# Patient Record
Sex: Female | Born: 1970 | Race: White | Hispanic: No | Marital: Married | Smoking: Never smoker
Health system: Southern US, Community
[De-identification: ages and names within clinical notes are randomized; demographics above are authoritative.]

## PROBLEM LIST (undated history)

## (undated) DIAGNOSIS — D259 Leiomyoma of uterus, unspecified: Secondary | ICD-10-CM

## (undated) DIAGNOSIS — N920 Excessive and frequent menstruation with regular cycle: Secondary | ICD-10-CM

## (undated) DIAGNOSIS — N83209 Unspecified ovarian cyst, unspecified side: Secondary | ICD-10-CM

## (undated) DIAGNOSIS — N939 Abnormal uterine and vaginal bleeding, unspecified: Secondary | ICD-10-CM

## (undated) DIAGNOSIS — R195 Other fecal abnormalities: Secondary | ICD-10-CM

## (undated) DIAGNOSIS — Z8489 Family history of other specified conditions: Secondary | ICD-10-CM

## (undated) DIAGNOSIS — R519 Headache, unspecified: Secondary | ICD-10-CM

## (undated) DIAGNOSIS — C439 Malignant melanoma of skin, unspecified: Secondary | ICD-10-CM

## (undated) DIAGNOSIS — D219 Benign neoplasm of connective and other soft tissue, unspecified: Secondary | ICD-10-CM

## (undated) HISTORY — DX: Leiomyoma of uterus, unspecified: D25.9

## (undated) HISTORY — DX: Leiomyoma of uterus, unspecified: N93.9

## (undated) HISTORY — DX: Headache, unspecified: R51.9

## (undated) HISTORY — DX: Other fecal abnormalities: R19.5

## (undated) HISTORY — PX: ABDOMINAL HYSTERECTOMY: SHX81

## (undated) HISTORY — DX: Excessive and frequent menstruation with regular cycle: N92.0

## (undated) HISTORY — DX: Abnormal uterine and vaginal bleeding, unspecified: N93.9

## (undated) HISTORY — DX: Unspecified ovarian cyst, unspecified side: N83.209

## (undated) HISTORY — PX: WISDOM TOOTH EXTRACTION: SHX21

## (undated) HISTORY — PX: OTHER SURGICAL HISTORY: SHX169

## (undated) HISTORY — DX: Benign neoplasm of connective and other soft tissue, unspecified: D21.9

---

## 1999-02-27 DIAGNOSIS — C439 Malignant melanoma of skin, unspecified: Secondary | ICD-10-CM

## 1999-02-27 HISTORY — DX: Malignant melanoma of skin, unspecified: C43.9

## 1999-02-27 HISTORY — PX: OTHER SURGICAL HISTORY: SHX169

## 2004-03-17 ENCOUNTER — Inpatient Hospital Stay: Payer: Self-pay | Admitting: Obstetrics & Gynecology

## 2005-04-23 ENCOUNTER — Ambulatory Visit: Payer: Self-pay | Admitting: Internal Medicine

## 2006-05-16 ENCOUNTER — Ambulatory Visit: Payer: Self-pay

## 2006-10-11 ENCOUNTER — Ambulatory Visit: Payer: Self-pay

## 2006-10-25 ENCOUNTER — Ambulatory Visit: Payer: Self-pay

## 2006-10-25 ENCOUNTER — Ambulatory Visit: Payer: Self-pay | Admitting: Urology

## 2006-10-31 ENCOUNTER — Ambulatory Visit: Payer: Self-pay

## 2006-11-22 ENCOUNTER — Emergency Department: Payer: Self-pay | Admitting: Emergency Medicine

## 2006-12-20 ENCOUNTER — Ambulatory Visit: Payer: Self-pay | Admitting: Unknown Physician Specialty

## 2007-02-27 HISTORY — PX: INTRAUTERINE DEVICE (IUD) INSERTION: SHX5877

## 2007-11-01 ENCOUNTER — Inpatient Hospital Stay: Payer: Self-pay | Admitting: Obstetrics & Gynecology

## 2009-03-12 ENCOUNTER — Emergency Department: Payer: Self-pay | Admitting: Emergency Medicine

## 2011-04-11 ENCOUNTER — Ambulatory Visit: Payer: Self-pay

## 2012-02-27 HISTORY — PX: IUD REMOVAL: SHX5392

## 2012-05-23 ENCOUNTER — Ambulatory Visit: Payer: Self-pay

## 2012-05-27 ENCOUNTER — Ambulatory Visit: Payer: Self-pay | Admitting: Obstetrics and Gynecology

## 2013-06-16 ENCOUNTER — Ambulatory Visit: Payer: Self-pay | Admitting: Obstetrics and Gynecology

## 2014-06-22 ENCOUNTER — Ambulatory Visit
Admit: 2014-06-22 | Disposition: A | Discharge status: Home / Self Care | Payer: Self-pay | Attending: Obstetrics and Gynecology | Admitting: Obstetrics and Gynecology

## 2015-06-21 ENCOUNTER — Other Ambulatory Visit: Payer: Self-pay | Admitting: Obstetrics and Gynecology

## 2015-06-21 DIAGNOSIS — Z1231 Encounter for screening mammogram for malignant neoplasm of breast: Secondary | ICD-10-CM

## 2015-06-30 ENCOUNTER — Ambulatory Visit
Admission: RE | Admit: 2015-06-30 | Discharge: 2015-06-30 | Disposition: A | Discharge status: Home / Self Care | Payer: BLUE CROSS/BLUE SHIELD | Source: Ambulatory Visit | Attending: Obstetrics and Gynecology | Admitting: Obstetrics and Gynecology

## 2015-06-30 DIAGNOSIS — Z1231 Encounter for screening mammogram for malignant neoplasm of breast: Secondary | ICD-10-CM | POA: Diagnosis not present

## 2015-06-30 HISTORY — DX: Malignant melanoma of skin, unspecified: C43.9

## 2016-03-04 DIAGNOSIS — J029 Acute pharyngitis, unspecified: Secondary | ICD-10-CM | POA: Diagnosis not present

## 2016-03-04 DIAGNOSIS — R05 Cough: Secondary | ICD-10-CM | POA: Diagnosis not present

## 2016-05-23 ENCOUNTER — Ambulatory Visit (INDEPENDENT_AMBULATORY_CARE_PROVIDER_SITE_OTHER): Payer: BLUE CROSS/BLUE SHIELD | Admitting: Certified Nurse Midwife

## 2016-05-23 ENCOUNTER — Encounter: Payer: Self-pay | Admitting: Certified Nurse Midwife

## 2016-05-23 VITALS — BP 102/62 | HR 64 | Ht 64.0 in | Wt 147.0 lb

## 2016-05-23 DIAGNOSIS — Z13220 Encounter for screening for lipoid disorders: Secondary | ICD-10-CM | POA: Diagnosis not present

## 2016-05-23 DIAGNOSIS — Z85820 Personal history of malignant melanoma of skin: Secondary | ICD-10-CM

## 2016-05-23 DIAGNOSIS — Z01419 Encounter for gynecological examination (general) (routine) without abnormal findings: Secondary | ICD-10-CM

## 2016-05-23 DIAGNOSIS — Z131 Encounter for screening for diabetes mellitus: Secondary | ICD-10-CM

## 2016-05-23 DIAGNOSIS — N852 Hypertrophy of uterus: Secondary | ICD-10-CM

## 2016-05-23 DIAGNOSIS — Z124 Encounter for screening for malignant neoplasm of cervix: Secondary | ICD-10-CM | POA: Diagnosis not present

## 2016-05-23 DIAGNOSIS — Z9889 Other specified postprocedural states: Secondary | ICD-10-CM

## 2016-05-23 NOTE — Progress Notes (Signed)
Gynecology Annual Exam  PCP: No PCP Per Patient  Chief Complaint:  Chief Complaint  Patient presents with  . Gynecologic Exam    History of Present Illness: Patient is a 46 y.o. G2 P2002, LMP 05/14/2016,  presents for her annual exam. The patient reports feeling "for a while"  like her "uterus has dropped". She complains of feeling bloated, and has feelings of having to urinate more frequently and has been having more problems with SUI x1 year. Denies dysuria. Menses are regular. They occur every 1 month , they last 4 days , with 2 heavier days requiring a tampon and pad change every 1.5 to 2 hours,  and are with nickel-sized clots. She has not had any intermenstrual spotting. She denies dysmenorrhea but does have menstrual headaches, relieved with ibup.    The patient's past medical history is remarkable for melanoma. Since her last annual GYN exam dated 05/23/2015 , she has had no other significant changes in her health history (was treated for sinusitis).    She is sexually active. She is currently using a vasectomy for contraception. She denies any history of STDs.  Her most recent pap smear was obtained 04/28/2013 and was negative cells with negative HPV DNA.  Her most recent mammogram obtained on 06/30/2015 was normal. There is no family history of breast cancer. There is no family history of ovarian cancer. The patient does do monthly self breast exams. Her PGF and paternal uncle both had colon cancer The patient does not smoke.  The patient does not drink alcohol.  The patient does not use illegal drugs.  The patient exercises regularly. She has a BMI of 25.23 kg/m2 (05/23/2015).  The patient does not get adequate calcium in her diet.  She had a recent cholesterol screen in 2015 that was normal. She desires other screening labs.     Review of Systems: Review of Systems  Constitutional: Negative for chills, fever and weight loss.  HENT: Negative for congestion, sinus pain and  sore throat.   Eyes: Negative for blurred vision and pain.  Respiratory: Negative for hemoptysis, shortness of breath and wheezing.   Cardiovascular: Negative for chest pain, palpitations and leg swelling.  Gastrointestinal: Positive for constipation. Negative for abdominal pain, blood in stool, diarrhea, heartburn, nausea and vomiting.  Genitourinary: Positive for frequency. Negative for dysuria, hematuria and urgency.       Positive for urinary incontinence/ pelvic pressure/and menorrhagia  Musculoskeletal: Negative for back pain, joint pain and myalgias.  Skin: Negative for itching and rash.  Neurological: Negative for dizziness, tingling and headaches.  Endo/Heme/Allergies: Negative for environmental allergies and polydipsia. Does not bruise/bleed easily.       Negative for hirsutism   Psychiatric/Behavioral: Negative for depression. The patient is not nervous/anxious and does not have insomnia.     Past Medical History:  Past Medical History:  Diagnosis Date  . Melanoma ([place]) 2001   upper abdomen in the 90s/ Dr Nehemiah Massed    Past Surgical History:    Obstetric History: No obstetric history on file.  Family History:  Family History  Problem Relation Age of Onset  . Hypertension Father   . Diabetes Father   . Hyperlipidemia Brother   . Hypertension Brother   . Colon cancer Paternal Grandfather 5  . Colon cancer Paternal Uncle   . Breast cancer Neg Hx     Social History:  Social History   Social History  . Marital status: Married    Spouse name:  Gaspar Bidding Number of children: 2  . Years of education: N/A   Occupational History  . Insurance agent    Social History Main Topics  . Smoking status: Never Smoker  . Smokeless tobacco: Never Used  . Alcohol use No  . Drug use: No  . Sexual activity: Yes    Birth control/ protection: Other-see comments     Comment: vasectomy   Other Topics Concern  . Not on file   Social History Narrative  . No narrative on file      Allergies:  Allergies  Allergen Reactions  . Promethazine Other (See Comments)    uncontrollable reflex    Medications: Prior to Admission medications   Not on File    Physical Exam Vitals: Blood pressure 102/62, pulse 64, height 5\' 4"  (1.626 m), weight 66.7 kg (147 lb), lBMI=25.23 kg/m2 last menstrual period 05/14/2016.  General: NAD HEENT: normocephalic, anicteric Thyroid: no enlargement, no palpable nodules Pulmonary: No increased work of breathing, CTAB Cardiovascular: RRR without murmur Breast: Breast symmetrical, no tenderness, no palpable nodules or masses, no skin or nipple retraction present, no nipple discharge.  No axillary, infraclavicular, or supraclavicular lymphadenopathy. Abdomen: soft, non-tender, non-distended.  Umbilicus without lesions.  No hepatomegaly or masses palpable. No evidence of hernia  Genitourinary:  External: Normal external female genitalia.  Normal urethral meatus, normal Bartholin's and Skene's glands.    Vagina: Normal vaginal mucosa, no evidence of prolapse.    Cervix: Grossly normal in appearance, no bleeding  Uterus: enlarged, irregular uterus on the right, AV, NT  Adnexa: ovaries non-enlarged, no adnexal masses  Rectal: deferred  Lymphatic: no evidence of inguinal lymphadenopathy Extremities: no edema, erythema, or tenderness Neurologic: Grossly intact Psychiatric: mood appropriate, affect full       Assessment: 46 y.o. well woman exam with irregular enlarged uterus. Suspect fibroids which may be responsible for pressure on bladder giving her symptoms of urinary incontinence and pelvic pressure    Plan: Problem List Items Addressed This Visit    None    Visit Diagnoses    Encounter for gynecological examination    -  Primary   Relevant Orders   IGP, Aptima HPV (Completed)   Screening for cervical cancer       Relevant Orders   IGP, Aptima HPV (Completed)   Screening for hyperlipidemia       Relevant Orders   Lipid  Panel With LDL/HDL Ratio (Completed)   Screening for diabetes mellitus       Relevant Orders   Hgb A1c w/o eAG (Completed)   History of melanoma excision       Relevant Orders   Comprehensive metabolic panel (Completed)   Bulky or enlarged uterus       Relevant Orders   US Transvaginal Non-OB      1) Mammogram  - recommend yearly screening mammogram and monthly self breast exam. Patient to schedule her screening mammogram.  2)  Pap  done  3) Osteoporosis prevention.-calcium and vitamin D3 requirements discussed -continue exercise 4) Routine healthcare maintenance including cholesterol, diabetes screening ordered  5) Scheduled for pelvic ultrasound. Follow up with me after ultrasound.  Dalia Heading, CNM

## 2016-05-24 LAB — COMPREHENSIVE METABOLIC PANEL
A/G RATIO: 1.8 (ref 1.2–2.2)
ALK PHOS: 95 IU/L (ref 39–117)
ALT: 20 IU/L (ref 0–32)
AST: 19 IU/L (ref 0–40)
Albumin: 4.4 g/dL (ref 3.5–5.5)
BUN/Creatinine Ratio: 18 (ref 9–23)
BUN: 12 mg/dL (ref 6–24)
Bilirubin Total: 0.7 mg/dL (ref 0.0–1.2)
CO2: 23 mmol/L (ref 18–29)
CREATININE: 0.68 mg/dL (ref 0.57–1.00)
Calcium: 9.2 mg/dL (ref 8.7–10.2)
Chloride: 102 mmol/L (ref 96–106)
GFR calc Af Amer: 122 mL/min/{1.73_m2} (ref >59)
GFR calc non Af Amer: 106 mL/min/{1.73_m2} (ref >59)
GLOBULIN, TOTAL: 2.5 g/dL (ref 1.5–4.5)
Glucose: 87 mg/dL (ref 65–99)
POTASSIUM: 4.9 mmol/L (ref 3.5–5.2)
SODIUM: 140 mmol/L (ref 134–144)
Total Protein: 6.9 g/dL (ref 6.0–8.5)

## 2016-05-24 LAB — LIPID PANEL WITH LDL/HDL RATIO
CHOLESTEROL TOTAL: 156 mg/dL (ref 100–199)
HDL: 51 mg/dL (ref >39)
LDL Calculated: 94 mg/dL (ref 0–99)
LDl/HDL Ratio: 1.8 ratio units (ref 0.0–3.2)
Triglycerides: 57 mg/dL (ref 0–149)
VLDL CHOLESTEROL CAL: 11 mg/dL (ref 5–40)

## 2016-05-24 LAB — HGB A1C W/O EAG: HEMOGLOBIN A1C: 5 % (ref 4.8–5.6)

## 2016-05-26 LAB — IGP, APTIMA HPV
HPV Aptima: NEGATIVE
PAP SMEAR COMMENT: 0

## 2016-06-06 ENCOUNTER — Encounter: Payer: Self-pay | Admitting: Certified Nurse Midwife

## 2016-06-08 ENCOUNTER — Encounter: Payer: Self-pay | Admitting: Certified Nurse Midwife

## 2016-06-08 ENCOUNTER — Ambulatory Visit (INDEPENDENT_AMBULATORY_CARE_PROVIDER_SITE_OTHER): Payer: BLUE CROSS/BLUE SHIELD | Admitting: Certified Nurse Midwife

## 2016-06-08 ENCOUNTER — Ambulatory Visit (INDEPENDENT_AMBULATORY_CARE_PROVIDER_SITE_OTHER): Payer: BLUE CROSS/BLUE SHIELD

## 2016-06-08 VITALS — BP 112/72 | HR 71 | Ht 64.0 in | Wt 146.0 lb

## 2016-06-08 DIAGNOSIS — N852 Hypertrophy of uterus: Secondary | ICD-10-CM

## 2016-06-08 DIAGNOSIS — D251 Intramural leiomyoma of uterus: Secondary | ICD-10-CM | POA: Diagnosis not present

## 2016-06-08 NOTE — Patient Instructions (Signed)

## 2016-06-10 ENCOUNTER — Encounter: Payer: Self-pay | Admitting: Certified Nurse Midwife

## 2016-06-10 DIAGNOSIS — D251 Intramural leiomyoma of uterus: Secondary | ICD-10-CM

## 2016-06-10 HISTORY — DX: Intramural leiomyoma of uterus: D25.1

## 2016-06-10 NOTE — Progress Notes (Signed)
  HPI: 46 year old female who presented for an annual exam with complaints of pelvic pressure and worsening stress urinary incontinence. On exam she was noted to have an enlarged and irregular uterus. She returns today for a follow visit after her pelvic  ultrasound today  Ultrasound demonstrates two fibroids, the largest is 4.4 x 3.6 cm and is in the lower uterine segment. The second fibroid is 3.8 x3.7 cm. Endometrial stripe is 9 mm  PMHx: She  has a past medical history of Melanoma ([place]) (2001). Also,  has a past surgical history that includes excision of melanoma and Wisdom tooth extraction., family history includes Colon cancer in her paternal uncle; Colon cancer (age of onset: 63) in her paternal grandfather; Diabetes in her father; Hyperlipidemia in her brother; Hypertension in her brother and father.,  reports that she has never smoked. She has never used smokeless tobacco. She reports that she does not drink alcohol or use drugs.  She currently has no medications in their medication list. Also, is allergic to promethazine.  ROS  Objective: BP 112/72   Pulse 71   Ht 5\' 4"  (2.841 m)   Wt 146 lb (66.2 kg)   LMP 05/14/2016 (Exact Date)   BMI 25.06 kg/m   Physical examination Constitutional NAD, Conversant  Skin No rashes, lesions or ulceration.   Extremities: Moves all appropriately.  Normal ROM for age. No lymphadenopathy.  Neuro: Grossly intact  Psych: Oriented to PPT.  Normal mood. Normal affect.   Assessment:  Uterine fibroids-symptomatic for pelvic pressure and worsening SUI  Plan: Discussed expectant management vs vs surgical or radiological intervention. Patient desires to talk further of surgical treatment options with gynecologist Schedule conference appt with Dr Kenton Kingfisher in 1 mos  Dalia Heading, [place]

## 2016-07-06 ENCOUNTER — Other Ambulatory Visit: Payer: Self-pay | Admitting: Certified Nurse Midwife

## 2016-07-06 ENCOUNTER — Other Ambulatory Visit: Payer: Self-pay | Admitting: Obstetrics and Gynecology

## 2016-07-06 DIAGNOSIS — Z1231 Encounter for screening mammogram for malignant neoplasm of breast: Secondary | ICD-10-CM

## 2016-07-09 ENCOUNTER — Ambulatory Visit: Payer: BLUE CROSS/BLUE SHIELD | Admitting: Obstetrics & Gynecology

## 2016-07-20 DIAGNOSIS — S0300XA Dislocation of jaw, unspecified side, initial encounter: Secondary | ICD-10-CM | POA: Diagnosis not present

## 2016-07-20 DIAGNOSIS — H9201 Otalgia, right ear: Secondary | ICD-10-CM | POA: Diagnosis not present

## 2016-07-20 DIAGNOSIS — H6991 Unspecified Eustachian tube disorder, right ear: Secondary | ICD-10-CM | POA: Diagnosis not present

## 2016-07-30 DIAGNOSIS — L723 Sebaceous cyst: Secondary | ICD-10-CM | POA: Diagnosis not present

## 2016-07-30 DIAGNOSIS — H93299 Other abnormal auditory perceptions, unspecified ear: Secondary | ICD-10-CM | POA: Diagnosis not present

## 2016-07-30 DIAGNOSIS — M26609 Unspecified temporomandibular joint disorder, unspecified side: Secondary | ICD-10-CM | POA: Diagnosis not present

## 2016-08-01 ENCOUNTER — Ambulatory Visit
Admission: RE | Admit: 2016-08-01 | Discharge: 2016-08-01 | Disposition: A | Discharge status: Home / Self Care | Payer: BLUE CROSS/BLUE SHIELD | Source: Ambulatory Visit | Attending: Certified Nurse Midwife | Admitting: Certified Nurse Midwife

## 2016-08-01 DIAGNOSIS — Z1231 Encounter for screening mammogram for malignant neoplasm of breast: Secondary | ICD-10-CM | POA: Diagnosis not present

## 2016-11-12 ENCOUNTER — Encounter: Payer: Self-pay | Admitting: Primary Care

## 2016-11-12 ENCOUNTER — Ambulatory Visit (INDEPENDENT_AMBULATORY_CARE_PROVIDER_SITE_OTHER): Payer: BLUE CROSS/BLUE SHIELD | Admitting: Primary Care

## 2016-11-12 VITALS — BP 108/70 | HR 68 | Temp 98.3°F | Ht 63.5 in | Wt 148.1 lb

## 2016-11-12 DIAGNOSIS — Z23 Encounter for immunization: Secondary | ICD-10-CM

## 2016-11-12 DIAGNOSIS — K5909 Other constipation: Secondary | ICD-10-CM

## 2016-11-12 DIAGNOSIS — R1030 Lower abdominal pain, unspecified: Secondary | ICD-10-CM | POA: Diagnosis not present

## 2016-11-12 DIAGNOSIS — R140 Abdominal distension (gaseous): Secondary | ICD-10-CM

## 2016-11-12 MED ORDER — DICYCLOMINE HCL 10 MG PO CAPS: ORAL_CAPSULE | ORAL | 0 refills | Status: DC

## 2016-11-12 NOTE — Addendum Note (Signed)
Addended by: Jacqualin Combes on: 11/12/2016 05:12 PM   Modules accepted: Orders

## 2016-11-12 NOTE — Assessment & Plan Note (Signed)
Ongoing for years. Not drinking enough water, not exercising, decent fiber intake. Discussed to increase water to 64 ounces daily, start exercising. Discussed to increase docusate sodium to daily use as needed. Do suspect rectal bleeding to be secondary to hemorrhoid secondary to straining during bowel movements. No alarm signs.

## 2016-11-12 NOTE — Progress Notes (Signed)
Subjective:    Patient ID: Cheryl Schmidt, female    DOB: 1970-08-27, 46 y.o.   MRN: [id number]  HPI  Cheryl Schmidt is a 46 year old female who presents today to establish care and discuss the problems mentioned below. Will obtain old records.  1) Chronic Constipation/Abdominal Pain: History of chronic constipation for years. Bloating and abdominal pain have been present for the past 2 months. She has noticed scant amount of bright red blood on occasion when wiping after straining during a bowel movement with constipation.   She is currently taking docusate sodium twice weekly on average. Bowel movements occur every 3-4 days on average. She denies personal and family history of diverticulitis, ulcerative colitis, Crohn's disease. Bloating will occur with each meal that will last several hours. She denies unexplained weight loss, increased rectal bleeding. Family history of colon cancer in paternal grandfather and paternal uncle.   Diet currently consists of:  Breakfast: Yogurt, granola bar Lunch: Salad, sandwich Dinner: Meat, pasta, green beans, asparagus, salad, brussels sprouts, corn Snacks: None Desserts: None Beverages: She drinks 16 ounces of water daily, mostly drinks half sweet/unsweet tea.   Exercise: Currently not exercising.    Review of Systems  Constitutional: Negative for fever and unexpected weight change.  Respiratory: Negative for shortness of breath.   Cardiovascular: Negative for chest pain.  Gastrointestinal: Positive for abdominal distention, abdominal pain, blood in stool and constipation. Negative for diarrhea, nausea and rectal pain.  Neurological: Negative for weakness.       Past Medical History:  Diagnosis Date  . Melanoma ([place]) 2001   upper abdomen in the 90s/ Dr Nehemiah Massed     Social History   Social History  . Marital status: Married    Spouse name: Gaspar Bidding  . Number of children: 2  . Years of education: N/A   Occupational History  . Insurance  agent    Social History Main Topics  . Smoking status: Never Smoker  . Smokeless tobacco: Never Used  . Alcohol use No  . Drug use: No  . Sexual activity: Yes    Birth control/ protection: Other-see comments     Comment: vasectomy   Other Topics Concern  . Not on file   Social History Narrative   Married.   2 children.   Works in Insurance underwriter.   Enjoys spending time with family, walking, exercising.    Past Surgical History:  Procedure Laterality Date  . excision of melanoma    . WISDOM TOOTH EXTRACTION      Family History  Problem Relation Age of Onset  . Hypertension Father   . Diabetes Father   . Hyperlipidemia Brother   . Hypertension Brother   . Colon cancer Paternal Grandfather 72  . Colon cancer Paternal Uncle   . Breast cancer Neg Hx     Allergies  Allergen Reactions  . Promethazine Other (See Comments)    uncontrollable reflex    No current outpatient prescriptions on file prior to visit.   No current facility-administered medications on file prior to visit.     BP 108/70   Pulse 68   Temp 98.3 F (36.8 C) (Oral)   Ht 5' 3.5" (1.613 m)   Wt 148 lb 1.9 oz (67.2 kg)   LMP 10/24/2016   SpO2 97%   BMI 25.83 kg/m    Objective:   Physical Exam  Constitutional: She appears well-nourished.  Neck: Neck supple.  Cardiovascular: Normal rate and regular rhythm.   Pulmonary/Chest: Effort  normal and breath sounds normal.  Abdominal: Soft. Normal appearance and bowel sounds are normal. There is tenderness in the right lower quadrant and left lower quadrant.  Skin: Skin is warm and dry.  Psychiatric: She has a normal mood and affect.          Assessment & Plan:

## 2016-11-12 NOTE — Assessment & Plan Note (Signed)
Exam today with tenderness, doesn't appear acutely ill. Labs in March 2018 reviewed and are reassuring. 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 Will obtain abdominal ultrasound for further evaluation. Rx for dicyclomine trail sent to pharmacy for bloating. She will update if no improvement in 1-2 weeks.   Consider GI referral for colonoscopy if no improvement.

## 2016-11-12 NOTE — Patient Instructions (Signed)
Increase intake of fiber and water to reduce constipation.  Start exercising. You should be getting 150 minutes of moderate intensity exercise weekly.  Ensure you are consuming 64 ounces of water daily.  You may take the stool softener daily if needed for constipation.  Stop by the front desk and speak with either Rosaria Ferries or Shirlean Mylar regarding your ultrasound.  Please notify me if no improvement in 1-2 weeks.   It was a pleasure to meet you today! Please don't hesitate to call me with any questions. Welcome to [facility]!    High-Fiber Diet Fiber, also called dietary fiber, is a type of carbohydrate found in fruits, vegetables, whole grains, and beans. A high-fiber diet can have many health benefits. Your health care provider may recommend a high-fiber diet to help:  Prevent constipation. Fiber can make your bowel movements more regular.  Lower your cholesterol.  Relieve hemorrhoids, uncomplicated diverticulosis, or irritable bowel syndrome.  Prevent overeating as part of a weight-loss plan.  Prevent heart disease, type 2 diabetes, and certain cancers.  What is my plan? The recommended daily intake of fiber includes:  38 grams for men under age 26.  36 grams for men over age 61.  45 grams for women under age 6.  47 grams for women over age 44.  You can get the recommended daily intake of dietary fiber by eating a variety of fruits, vegetables, grains, and beans. Your health care provider may also recommend a fiber supplement if it is not possible to get enough fiber through your diet. What do I need to know about a high-fiber diet?  Fiber supplements have not been widely studied for their effectiveness, so it is better to get fiber through food sources.  Always check the fiber content on thenutrition facts label of any prepackaged food. Look for foods that contain at least 5 grams of fiber per serving.  Ask your dietitian if you have questions about specific foods that are  related to your condition, especially if those foods are not listed in the following section.  Increase your daily fiber consumption gradually. Increasing your intake of dietary fiber too quickly may cause bloating, cramping, or gas.  Drink plenty of water. Water helps you to digest fiber. What foods can I eat? Grains Whole-grain breads. Multigrain cereal. Oats and oatmeal. Brown rice. Barley. Bulgur wheat. [place]. Bran muffins. Popcorn. Rye wafer crackers. Vegetables Sweet potatoes. Spinach. Kale. Artichokes. Cabbage. Broccoli. Green peas. Carrots. Squash. Fruits Berries. Pears. Apples. Oranges. Avocados. Prunes and raisins. Dried figs. Meats and Other Protein Sources Navy, kidney, pinto, and soy beans. Split peas. Lentils. Nuts and seeds. Dairy Fiber-fortified yogurt. Beverages Fiber-fortified soy milk. Fiber-fortified orange juice. Other Fiber bars. The items listed above may not be a complete list of recommended foods or beverages. Contact your dietitian for more options. What foods are not recommended? Grains White bread. Pasta made with refined flour. White rice. Vegetables Fried potatoes. Canned vegetables. Well-cooked vegetables. Fruits Fruit juice. Cooked, strained fruit. Meats and Other Protein Sources Fatty cuts of meat. Fried Sales executive or fried fish. Dairy Milk. Yogurt. Cream cheese. Sour cream. Beverages Soft drinks. Other Cakes and pastries. Butter and oils. The items listed above may not be a complete list of foods and beverages to avoid. Contact your dietitian for more information. What are some tips for including high-fiber foods in my diet?  Eat a wide variety of high-fiber foods.  Make sure that half of all grains consumed each day are whole grains.  Replace breads  and cereals made from refined flour or white flour with whole-grain breads and cereals.  Replace white rice with brown rice, bulgur wheat, or millet.  Start the day with a breakfast that is  high in fiber, such as a cereal that contains at least 5 grams of fiber per serving.  Use beans in place of meat in soups, salads, or pasta.  Eat high-fiber snacks, such as berries, raw vegetables, nuts, or popcorn. This information is not intended to replace advice given to you by your health care provider. Make sure you discuss any questions you have with your health care provider. Document Released: 02/12/2005 Document Revised: 07/21/2015 Document Reviewed: 07/28/2013 Elsevier Interactive Patient Education  2017 [employer].

## 2016-11-16 ENCOUNTER — Ambulatory Visit
Admission: RE | Admit: 2016-11-16 | Discharge: 2016-11-16 | Disposition: A | Discharge status: Home / Self Care | Payer: BLUE CROSS/BLUE SHIELD | Source: Ambulatory Visit | Attending: Primary Care | Admitting: Primary Care

## 2016-11-16 DIAGNOSIS — R140 Abdominal distension (gaseous): Secondary | ICD-10-CM | POA: Diagnosis not present

## 2016-11-16 DIAGNOSIS — K7689 Other specified diseases of liver: Secondary | ICD-10-CM | POA: Insufficient documentation

## 2016-11-16 DIAGNOSIS — R1030 Lower abdominal pain, unspecified: Secondary | ICD-10-CM | POA: Diagnosis not present

## 2016-11-16 DIAGNOSIS — K5909 Other constipation: Secondary | ICD-10-CM | POA: Diagnosis not present

## 2017-02-28 DIAGNOSIS — R070 Pain in throat: Secondary | ICD-10-CM | POA: Diagnosis not present

## 2017-02-28 DIAGNOSIS — H6980 Other specified disorders of Eustachian tube, unspecified ear: Secondary | ICD-10-CM | POA: Diagnosis not present

## 2017-02-28 DIAGNOSIS — D489 Neoplasm of uncertain behavior, unspecified: Secondary | ICD-10-CM | POA: Diagnosis not present

## 2017-04-25 DIAGNOSIS — L723 Sebaceous cyst: Secondary | ICD-10-CM | POA: Diagnosis not present

## 2017-04-25 DIAGNOSIS — J019 Acute sinusitis, unspecified: Secondary | ICD-10-CM | POA: Diagnosis not present

## 2017-05-31 ENCOUNTER — Encounter: Payer: Self-pay | Admitting: Certified Nurse Midwife

## 2017-05-31 ENCOUNTER — Ambulatory Visit (INDEPENDENT_AMBULATORY_CARE_PROVIDER_SITE_OTHER): Payer: BLUE CROSS/BLUE SHIELD | Admitting: Certified Nurse Midwife

## 2017-05-31 VITALS — BP 100/60 | HR 72 | Ht 64.0 in | Wt 146.0 lb

## 2017-05-31 DIAGNOSIS — Z1231 Encounter for screening mammogram for malignant neoplasm of breast: Secondary | ICD-10-CM

## 2017-05-31 DIAGNOSIS — R5383 Other fatigue: Secondary | ICD-10-CM

## 2017-05-31 DIAGNOSIS — D219 Benign neoplasm of connective and other soft tissue, unspecified: Secondary | ICD-10-CM | POA: Diagnosis not present

## 2017-05-31 DIAGNOSIS — Z124 Encounter for screening for malignant neoplasm of cervix: Secondary | ICD-10-CM | POA: Diagnosis not present

## 2017-05-31 DIAGNOSIS — N920 Excessive and frequent menstruation with regular cycle: Secondary | ICD-10-CM | POA: Diagnosis not present

## 2017-05-31 DIAGNOSIS — Z0000 Encounter for general adult medical examination without abnormal findings: Secondary | ICD-10-CM

## 2017-05-31 DIAGNOSIS — Z1239 Encounter for other screening for malignant neoplasm of breast: Secondary | ICD-10-CM

## 2017-05-31 DIAGNOSIS — D259 Leiomyoma of uterus, unspecified: Secondary | ICD-10-CM | POA: Insufficient documentation

## 2017-05-31 NOTE — Progress Notes (Signed)
Gynecology Annual Exam  PCP: Pleas Koch, NP  Chief Complaint:  Chief Complaint  Patient presents with  . Gynecologic Exam    History of Present Illness: Patient is a 47 y.o. MWF, G2 P2002, LMP 05/14/2016,  Who presents for her annual exam. The patient was diagnosed with fibroids last year and she continues to complain of feeling bloated, urinary frequency and lower abdominal tenderness.  Menses are regular. They occur every 1 month , they last 4-5 days , with 2-3 heavier days requiring a tampon and pad change every 1 to 2 hours,  and are with nickel-sized clots. She has not had any intermenstrual spotting. She has dysmenorrhea beginning a couple days before her menses and lasting thru the first 2-3 days of her menses. The patient's past medical history is remarkable for melanoma. She is followed by Dr Nehemiah Massed annually. She has chronic constipation and has hemorrhoids which sometimes bleeds with straining to have a BM. She saw her PCP last year who recommended stool softeners and increased water intake which has had +/- effect. Since her last annual GYN exam dated 05/23/2016 , she has had no other significant changes in her health history. She is sexually active. She is currently using a vasectomy for contraception. She denies any history of STDs.  Her most recent pap smear was obtained 05/23/2016 and was negative cells with negative HPV DNA.  Her most recent mammogram obtained on 08/01/2016 was normal. There is no family history of breast cancer. There is no family history of ovarian cancer. The patient does do self breast exams about every other month. Her PGF and paternal uncle both had colon cancer The patient does not smoke.  The patient does drink alcohol occasionally (1 glass of wine/month)  The patient does not use illegal drugs.  The patient has been exercising less because she feels fatigued. She has a BMI of 25.06 kg/m2 (05/31/2017).  The patient does get adequate calcium in  her diet.  She had a recent cholesterol screen in 2018 that was normal.      Review of Systems: Review of Systems  Constitutional: Negative for chills, fever and weight loss.  HENT: Negative for congestion, sinus pain and sore throat.   Eyes: Negative for blurred vision and pain.  Respiratory: Negative for hemoptysis, shortness of breath and wheezing.   Cardiovascular: Negative for chest pain, palpitations and leg swelling.  Gastrointestinal: Positive for abdominal pain (tenderness), blood in stool (with straining/ hemorrhoids) and constipation. Negative for diarrhea, heartburn, nausea and vomiting.  Genitourinary: Positive for frequency. Negative for dysuria, hematuria and urgency.       Positive for urinary incontinence/ pelvic pressure/and menorrhagia  Musculoskeletal: Negative for back pain, joint pain and myalgias.  Skin: Negative for itching and rash.  Neurological: Positive for headaches. Negative for dizziness and tingling.  Endo/Heme/Allergies: Negative for environmental allergies and polydipsia. Does not bruise/bleed easily.       Negative for hirsutism   Psychiatric/Behavioral: Negative for depression. The patient is not nervous/anxious and does not have insomnia.     Past Medical History:  Past Medical History:  Diagnosis Date  . Fibroids   . Melanoma ([place]) 2001   upper abdomen in the 90s/ Dr Nehemiah Massed  . Menorrhagia with regular cycle   . Ovarian cyst     Past Surgical History:  Past Surgical History:  Procedure Laterality Date  . Excision of melanoma    . INTRAUTERINE DEVICE (IUD) INSERTION  2009  . IUD REMOVAL  2014  . WISDOM TOOTH EXTRACTION       Obstetric History:  OB History  Gravida Para Term Preterm AB Living  2 2 2     2   SAB TAB Ectopic Multiple Live Births          2    # Outcome Date GA Lbr Len/2nd Weight Sex Delivery Anes PTL Lv  2 Term 11/01/07   7 lb 5 oz (3.317 kg) M Vag-Spont   LIV  1 Term 03/18/04   7 lb 9 oz (3.43 kg) F Vag-Spont    LIV    Family History:  Family History  Problem Relation Age of Onset  . Hypertension Father   . Diabetes Father   . Hyperlipidemia Brother   . Hypertension Brother   . Colon cancer Paternal Grandfather 89  . Colon cancer Paternal Uncle 44  . Breast cancer Neg Hx     Social History:  Social History   Socioeconomic History  . Marital status: Married    Spouse name: Gaspar Bidding  . Number of children: 2  . Years of education: Not on file  . Highest education level: Not on file  Occupational History  . Occupation: Medical illustrator  Social Needs  . Financial resource strain: Not on file  . Food insecurity:    Worry: Not on file    Inability: Not on file  . Transportation needs:    Medical: Not on file    Non-medical: Not on file  Tobacco Use  . Smoking status: Never Smoker  . Smokeless tobacco: Never Used  Substance and Sexual Activity  . Alcohol use: Yes    Comment: 1 glass of wine/month  . Drug use: No  . Sexual activity: Yes    Partners: Male    Birth control/protection: Other-see comments    Comment: vasectomy  Lifestyle  . Physical activity:    Days per week: 2 days    Minutes per session: 60 min  . Stress: Only a little  Relationships  . Social connections:    Talks on phone: Not on file    Gets together: Not on file    Attends religious service: Not on file    Active member of club or organization: Not on file    Attends meetings of clubs or organizations: Not on file    Relationship status: Not on file  . Intimate partner violence:    Fear of current or ex partner: Not on file    Emotionally abused: Not on file    Physically abused: Not on file    Forced sexual activity: Not on file  Other Topics Concern  . Not on file  Social History Narrative   Married.   2 children.   Works in Insurance underwriter.   Enjoys spending time with family, walking, exercising.    Allergies:  Allergies  Allergen Reactions  . Promethazine Other (See Comments)    uncontrollable  reflex    Medications: Current Outpatient Medications:  .  naproxen sodium (ALEVE) 220 MG tablet, Take by mouth., Disp: , Rfl:  Physical Exam Vitals: BP 100/60   Pulse 72   Ht 5\' 4"  (1.626 m)   Wt 146 lb (66.2 kg)   LMP 05/15/2017 (Exact Date)   BMI 25.06 kg/m  General: WF in NAD HEENT: normocephalic, anicteric Thyroid: no enlargement, no palpable nodules Pulmonary: No increased work of breathing, CTAB Cardiovascular: RRR without murmur Breast: Breast symmetrical, no tenderness, no palpable nodules or masses, no skin or nipple  retraction present, no nipple discharge.  No axillary, infraclavicular, or supraclavicular lymphadenopathy. Abdomen: soft, non-distended.  Umbilicus without lesions.  No hepatomegaly. Fundal height 4FB above the symphysis/ TTP in the lower abdomen. No evidence of hernia  Genitourinary:  External: Normal external female genitalia.  Normal urethral meatus, normal Bartholin's and Skene's glands.    Vagina: Normal vaginal mucosa, no evidence of prolapse.    Cervix: Grossly normal in appearance, no bleeding, posterior  Uterus: enlarged 12 week size, irregular uterus on the right, AV, mild tenderness  Adnexa: ovaries non-enlarged, no adnexal masses-difficult exam due to enlarged uterus.  Rectal: deferred  Lymphatic: no evidence of inguinal lymphadenopathy Extremities: no edema, erythema, or tenderness Neurologic: Grossly intact Psychiatric: mood appropriate, affect full       Assessment: 47 y.o. well woman exam with 12 week size uterus due to symptomatic fibroids.   Patient would like appointment to see MD to discuss options for treatment of fibroids.-will schedule   Fatigue: possibly due to anemia from menorrhagia   CBC, ferratin, vit B12, Vitamin D3   Plan: Problem List Items Addressed This Visit      Other   Fibroids   Menorrhagia with regular cycle   Relevant Orders   CBC with Differential/Platelet   Ferritin    Other Visit Diagnoses     Fatigue, unspecified type    -  Primary   Relevant Orders   Vitamin B12   VITAMIN D 25 Hydroxy (Vit-D Deficiency, Fractures)   Screening for cervical cancer       Relevant Orders   IGP,rfx Aptima HPV all pth   Screening for breast cancer       Relevant Orders   MM DIGITAL SCREENING BILATERAL      1) Mammogram  - recommend yearly screening mammogram and monthly self breast exam. Patient to schedule her screening mammogram which is due after 08/01/2017  2)  Pap  done  3) Osteoporosis prevention.-calcium and vitamin D3 requirements discussed  4) Routine healthcare maintenance including cholesterol, diabetes screening UTD  5) Recommend Benefiber daily for tratment of chronic constipation/ hemorrhoids  Dalia Heading, CNM

## 2017-06-01 LAB — CBC WITH DIFFERENTIAL/PLATELET
Basophils Absolute: 0 10*3/uL (ref 0.0–0.2)
Basos: 1 %
EOS (ABSOLUTE): 0.1 10*3/uL (ref 0.0–0.4)
EOS: 1 %
HEMATOCRIT: 41 % (ref 34.0–46.6)
HEMOGLOBIN: 13 g/dL (ref 11.1–15.9)
IMMATURE GRANS (ABS): 0 10*3/uL (ref 0.0–0.1)
IMMATURE GRANULOCYTES: 0 %
LYMPHS: 24 %
Lymphocytes Absolute: 1.9 10*3/uL (ref 0.7–3.1)
MCH: 27.8 pg (ref 26.6–33.0)
MCHC: 31.7 g/dL (ref 31.5–35.7)
MCV: 88 fL (ref 79–97)
Monocytes Absolute: 0.6 10*3/uL (ref 0.1–0.9)
Monocytes: 8 %
NEUTROS PCT: 66 %
Neutrophils Absolute: 5.2 10*3/uL (ref 1.4–7.0)
Platelets: 319 10*3/uL (ref 150–379)
RBC: 4.67 x10E6/uL (ref 3.77–5.28)
RDW: 13.8 % (ref 12.3–15.4)
WBC: 7.8 10*3/uL (ref 3.4–10.8)

## 2017-06-01 LAB — VITAMIN D 25 HYDROXY (VIT D DEFICIENCY, FRACTURES): Vit D, 25-Hydroxy: 29.4 ng/mL — ABNORMAL LOW (ref 30.0–100.0)

## 2017-06-01 LAB — VITAMIN B12: Vitamin B-12: 557 pg/mL (ref 232–1245)

## 2017-06-01 LAB — FERRITIN: Ferritin: 15 ng/mL (ref 15–150)

## 2017-06-03 LAB — IGP,RFX APTIMA HPV ALL PTH: PAP Smear Comment: 0

## 2017-06-10 DIAGNOSIS — D2261 Melanocytic nevi of right upper limb, including shoulder: Secondary | ICD-10-CM | POA: Diagnosis not present

## 2017-06-10 DIAGNOSIS — D2260 Melanocytic nevi of unspecified upper limb, including shoulder: Secondary | ICD-10-CM | POA: Diagnosis not present

## 2017-06-10 DIAGNOSIS — D485 Neoplasm of uncertain behavior of skin: Secondary | ICD-10-CM | POA: Diagnosis not present

## 2017-06-10 DIAGNOSIS — Z85820 Personal history of malignant melanoma of skin: Secondary | ICD-10-CM | POA: Diagnosis not present

## 2017-06-10 DIAGNOSIS — Z1283 Encounter for screening for malignant neoplasm of skin: Secondary | ICD-10-CM | POA: Diagnosis not present

## 2017-06-10 DIAGNOSIS — D225 Melanocytic nevi of trunk: Secondary | ICD-10-CM | POA: Diagnosis not present

## 2017-06-10 DIAGNOSIS — L818 Other specified disorders of pigmentation: Secondary | ICD-10-CM | POA: Diagnosis not present

## 2017-06-13 ENCOUNTER — Telehealth: Payer: Self-pay

## 2017-06-13 NOTE — Telephone Encounter (Signed)
Pt returning CLG's call. [phone]

## 2017-06-14 NOTE — Telephone Encounter (Signed)
Contacted patient with lab results. Recommend 1000IU vitamin D3 daly and a multivitamin with iron

## 2017-07-01 ENCOUNTER — Ambulatory Visit (INDEPENDENT_AMBULATORY_CARE_PROVIDER_SITE_OTHER): Payer: BLUE CROSS/BLUE SHIELD | Admitting: Obstetrics and Gynecology

## 2017-07-01 ENCOUNTER — Encounter: Payer: Self-pay | Admitting: Obstetrics and Gynecology

## 2017-07-01 VITALS — BP 112/72 | HR 73 | Ht 64.0 in | Wt 150.0 lb

## 2017-07-01 DIAGNOSIS — N946 Dysmenorrhea, unspecified: Secondary | ICD-10-CM

## 2017-07-01 DIAGNOSIS — N939 Abnormal uterine and vaginal bleeding, unspecified: Secondary | ICD-10-CM

## 2017-07-01 DIAGNOSIS — D251 Intramural leiomyoma of uterus: Secondary | ICD-10-CM

## 2017-07-01 MED ORDER — LEUPROLIDE ACETATE (PED) 11.25 MG IM KIT: 11.2500 mg | PACK | INTRAMUSCULAR | 0 refills | Status: DC

## 2017-07-01 NOTE — Progress Notes (Signed)
Obstetrics & Gynecology Surgery H&P    Chief Complaint: Scheduled Surgery   History of Present Illness: Patient is a 47 y.o. [id number] presenting to discuss possible hysterectomy, for the treatment or further evaluation of abnormal uterine bleeding and dysmenorrhea.   Prior Treatments prior to proceeding with surgery include: expectant management  Preoperative Pap:05/23/2016 NIL HPV negative Preoperative Endometrial biopsy: discussed obtain at time of preop appointment Preoperative Ultrasound: 05/31/2017 showing a posterior cervical 4cm fibroid.  Adnexa imaged normally.  Size of fibroid stable in relation to prior imaging obtained 1 year ago 06/08/2016  H&H obtained 05/31/2017 13.0g/dL and 41.0%  Review of Systems:10 point review of systems  Past Medical History:  Past Medical History:  Diagnosis Date  . Fibroids   . Melanoma ([place]) 2001   upper abdomen in the 90s/ Dr Nehemiah Massed  . Menorrhagia with regular cycle   . Ovarian cyst     Past Surgical History:  Past Surgical History:  Procedure Laterality Date  . Excision of melanoma    . INTRAUTERINE DEVICE (IUD) INSERTION  2009  . IUD REMOVAL  2014  . WISDOM TOOTH EXTRACTION      Family History:  Family History  Problem Relation Age of Onset  . Hypertension Father   . Diabetes Father   . Hyperlipidemia Brother   . Hypertension Brother   . Colon cancer Paternal Grandfather 48  . Colon cancer Paternal Uncle 24  . Breast cancer Neg Hx     Social History:  Social History   Socioeconomic History  . Marital status: Married    Spouse name: Gaspar Bidding  . Number of children: 2  . Years of education: Not on file  . Highest education level: Not on file  Occupational History  . Occupation: Medical illustrator  Social Needs  . Financial resource strain: Not on file  . Food insecurity:    Worry: Not on file    Inability: Not on file  . Transportation needs:    Medical: Not on file    Non-medical: Not on file  Tobacco Use  .  Smoking status: Never Smoker  . Smokeless tobacco: Never Used  Substance and Sexual Activity  . Alcohol use: Yes    Comment: 1 glass of wine/month  . Drug use: No  . Sexual activity: Yes    Partners: Male    Birth control/protection: Other-see comments    Comment: vasectomy  Lifestyle  . Physical activity:    Days per week: 2 days    Minutes per session: 60 min  . Stress: Only a little  Relationships  . Social connections:    Talks on phone: Not on file    Gets together: Not on file    Attends religious service: Not on file    Active member of club or organization: Not on file    Attends meetings of clubs or organizations: Not on file    Relationship status: Not on file  . Intimate partner violence:    Fear of current or ex partner: Not on file    Emotionally abused: Not on file    Physically abused: Not on file    Forced sexual activity: Not on file  Other Topics Concern  . Not on file  Social History Narrative   Married.   2 children.   Works in Insurance underwriter.   Enjoys spending time with family, walking, exercising.    Allergies:  Allergies  Allergen Reactions  . Promethazine Other (See Comments)    uncontrollable reflex  Medications: Prior to Admission medications   Medication Sig Start Date End Date Taking? Authorizing Provider  naproxen sodium (ALEVE) 220 MG tablet Take by mouth.   Yes [provider]  leuprolide (LUPRON) 11.25 MG KIT injection Inject 11.25 mg into the muscle every 3 (three) months. 07/01/17   Malachy Mood, MD    Physical Exam Vitals: Blood pressure 112/72, pulse 73, height 5' 4"  (1.626 m), weight 150 lb (68 kg), last menstrual period 06/19/2017. General: NAD HEENT: normocephalic, anicteric Pulmonary: No increased work of breathing Neurologic: Grossly intact Psychiatric: mood appropriate, affect full  Imaging No results found.  Assessment: 47 y.o. [id number] presenting to discuss surgical options for symptomatic uterine  fibroids  Plan: 1) Patient opts for definitive surgical management via hysterectomy. The risks of surgery were discussed in detail with the patient including but not limited to: bleeding which may require transfusion or reoperation; infection which may require antibiotics; injury to bowel, bladder, ureters or other surrounding organs (With a literature reported rate of urinary tract injury of 1% quoted); need for additional procedures including laparotomy; thromboembolic phenomenon, incisional problems and other postoperative/anesthesia complications.  Patient was also advised that recovery procedure generally involves an overnight stay; and the  expected recovery time after a hysterectomy being in the range of 6-8 weeks.  Likelihood of success in alleviating the patient's symptoms was discussed.  While definitive in regards to issues with menstural bleeding, pelvic pain if present preoperatively may continue and in fact worsen postoperatively.  She is aware that the procedure will render her unable to pursue childbearing in the future.   She was told that she will be contacted by our surgical scheduler regarding the time and date of her surgery; routine preoperative instructions of having nothing to eat or drink after midnight on the day prior to surgery and also coming to the hospital 1.5 hours prior to her time of surgery were also emphasized.  She was told she may be called for a preoperative appointment about a week prior to surgery and will be given further preoperative instructions at that visit.  Routine postoperative instructions will be reviewed with the patient and her family in detail after surgery. Printed patient education handouts about the procedure was given to the patient to review at home. - we discussed medical management, including Mirena IUD, as well as [place].  We discussed contraindication to Novasure endometrial ablation given fibroid size. In setting of focal lesions these procedure or  options may decrease but not eliminate symptoms - Lupron rx - endometrial biopsy at time of follow up - TLH, BS, cysto in 3 months following Lupron   2) Follow up 3 months.  3) A total of 15 minutes were spent in face-to-face contact with the patient during this encounter with over half of that time devoted to counseling and coordination of care.    Malachy Mood, MD, [facility], [facility] 07/01/2017, 5:27 PM

## 2017-07-02 ENCOUNTER — Telehealth: Payer: Self-pay | Admitting: Obstetrics and Gynecology

## 2017-07-02 NOTE — Telephone Encounter (Signed)
Lmtrc

## 2017-07-02 NOTE — Telephone Encounter (Signed)
-----   Message from Malachy Mood, MD sent at 07/01/2017  5:37 PM EDT ----- Regarding: Surgery Surgery Date: 3 months  LOS: observation  Surgery Booking Request Patient Full Name: Cheryl Schmidt MRN: [id number]  DOB: 11/12/1970  Surgeon: Malachy Mood, MD  Requested Surgery Date and Time: 2hrs Primary Diagnosis and Code: Abnormal uterine bleeding Secondary Diagnosis and Code:  Dysmenorrhea, uterine fibroids Surgical Procedure: TLH/BS and cystoscopy L&D Notification:N/A Admission Status: observation Length of Surgery: 2hrs Special Case Needs: none H&P: week prior with endometrial biopsy (date) Phone Interview or Office Pre-Admit: pre-admit Interpreter: No Language: English Medical Clearance: No Special Scheduling Instructions: endometrial biopsy with H&P

## 2017-07-02 NOTE — Telephone Encounter (Signed)
May not need one it was listed as preferred

## 2017-07-02 NOTE — Telephone Encounter (Signed)
Patient returned the call and said she was waiting to hear whether the Lupron had been authorized, and that Dr. Georgianne Fick said the surgery would take place 3 months after starting Lupron. Patient would like someone to call her back about the Lupron authorization. Patient has my phone# and ext and will call me to schedule surgery one month ahead of time.

## 2017-07-02 NOTE — Telephone Encounter (Signed)
Pt aware no authorization has been received from pharmacy. She will check with pharmacy to see if it needs one. KJ CMA

## 2017-08-02 ENCOUNTER — Ambulatory Visit
Admission: RE | Admit: 2017-08-02 | Discharge: 2017-08-02 | Disposition: A | Discharge status: Home / Self Care | Payer: BLUE CROSS/BLUE SHIELD | Source: Ambulatory Visit | Attending: Certified Nurse Midwife | Admitting: Certified Nurse Midwife

## 2017-08-02 DIAGNOSIS — Z1231 Encounter for screening mammogram for malignant neoplasm of breast: Secondary | ICD-10-CM | POA: Diagnosis not present

## 2017-08-02 DIAGNOSIS — Z1239 Encounter for other screening for malignant neoplasm of breast: Secondary | ICD-10-CM

## 2017-08-26 DIAGNOSIS — L723 Sebaceous cyst: Secondary | ICD-10-CM | POA: Diagnosis not present

## 2017-08-26 DIAGNOSIS — H9319 Tinnitus, unspecified ear: Secondary | ICD-10-CM | POA: Diagnosis not present

## 2017-08-26 DIAGNOSIS — H9311 Tinnitus, right ear: Secondary | ICD-10-CM | POA: Diagnosis not present

## 2017-10-01 ENCOUNTER — Telehealth: Payer: Self-pay | Admitting: Obstetrics and Gynecology

## 2017-10-01 NOTE — Telephone Encounter (Signed)
Per Dr. Georgianne Fick, the patient should come in for an ultrasound and f/u 3 months after beginning Lupron. Lmtrc.

## 2017-10-03 NOTE — Telephone Encounter (Signed)
Lmtrc

## 2017-10-10 NOTE — Telephone Encounter (Signed)
Lmtrc

## 2017-10-16 NOTE — Telephone Encounter (Signed)
Lmtrc

## 2017-10-18 ENCOUNTER — Telehealth: Payer: Self-pay | Admitting: Obstetrics and Gynecology

## 2017-10-18 NOTE — Telephone Encounter (Signed)
Lmtrc

## 2017-10-18 NOTE — Telephone Encounter (Signed)
Per patient, she did not take the Lupron due to the timing of having surgery the same time school starts back. The patient will call back to make another appointment with Dr. Georgianne Fick in November and hopefully schedule surgery in January. I offered to make that appt but patient will call back.

## 2018-01-27 ENCOUNTER — Ambulatory Visit: Payer: BLUE CROSS/BLUE SHIELD | Admitting: Obstetrics and Gynecology

## 2018-08-07 ENCOUNTER — Other Ambulatory Visit: Payer: Self-pay | Admitting: Certified Nurse Midwife

## 2018-08-07 DIAGNOSIS — Z1231 Encounter for screening mammogram for malignant neoplasm of breast: Secondary | ICD-10-CM

## 2018-08-22 ENCOUNTER — Ambulatory Visit
Admission: RE | Admit: 2018-08-22 | Discharge: 2018-08-22 | Disposition: A | Discharge status: Home / Self Care | Payer: BC Managed Care – PPO | Source: Ambulatory Visit | Attending: Certified Nurse Midwife | Admitting: Certified Nurse Midwife

## 2018-08-22 ENCOUNTER — Other Ambulatory Visit: Payer: Self-pay

## 2018-08-22 DIAGNOSIS — Z1231 Encounter for screening mammogram for malignant neoplasm of breast: Secondary | ICD-10-CM | POA: Diagnosis not present

## 2018-09-22 NOTE — Progress Notes (Signed)
Gynecology Annual Exam  PCP: Pleas Koch, NP  Chief Complaint:  Chief Complaint  Patient presents with  . Gynecologic Exam    History of Present Illness: Cheryl Schmidt is a 48 y.o. MWF, G2 P2002, LMP 08/30/2018,  who presents for her annual exam. The patient was diagnosed with fibroids in 2018 and she continues to complain of feeling bloated, urinary frequency and lower abdominal tenderness, and menorrhagia. She was seen by Dr Georgianne Fick last year and they talked about doing a hysterectomy, proceeded by a dose of Lupron. She reports"chickening out" and decided not to have surgery.   Menses are regular. They occur every 1 month , they last 3-7 days , with 2 heavier days requiring a tampon and pad change every hour, and are with clots. She has to set her alarm at night to get up to change pads, or she will soil her bed linen.  She has not had any intermenstrual spotting. She has dysmenorrhea beginning a couple days before her menses and lasting thru the first 2-3 days of her menses. The patient's past medical history is remarkable for melanoma. She is followed by Dr Nehemiah Massed annually. She has chronic constipation and has hemorrhoids which sometimes bleeds with straining to have a BM. She saw her PCP last year who recommended stool softeners and increased water intake which has had +/- effect. Since her last annual GYN exam dated 05/31/2017, she has had no other significant changes in her health history. She is working from home since [place] 19 pandemic. She has felt somewhat anxious and depressed regarding the COVID restrictions and staying home. She is sexually active. She is currently using a vasectomy for contraception. She denies any history of STDs.  Her most recent pap smear was obtained 05/31/2017 and was NIL Her most recent mammogram obtained on 08/22/2018 was normal. There is no family history of breast cancer. There is no family history of ovarian cancer. The patient does do self breast exams  about every other month. Her PGF and paternal uncle both had colon cancer The patient does not smoke.  The patient does drink alcohol The patient does not use illegal drugs.  The patient has been exercising by walking one hour three to four times a week. She has a BMI of 26.25 kg/m2 (09/23/2018).  The patient does get adequate calcium in her diet.  She had a recent cholesterol screen in 2018 that was normal. H&H was normal last year, but ferratin was low normal (15) and vitamin D3 was low. She does not take any vitamins.     Review of Systems: Review of Systems  Constitutional: Negative for chills, fever and weight loss.  HENT: Negative for congestion, sinus pain and sore throat.   Eyes: Negative for blurred vision and pain.  Respiratory: Negative for hemoptysis, shortness of breath and wheezing.   Cardiovascular: Negative for chest pain, palpitations and leg swelling.  Gastrointestinal: Positive for abdominal pain (tenderness), blood in stool (with straining/ hemorrhoids), constipation and heartburn (relieved with Tums). Negative for diarrhea, nausea and vomiting.  Genitourinary: Positive for frequency. Negative for dysuria, hematuria and urgency.       Positive for urinary incontinence/ pelvic pressure/and menorrhagia  Musculoskeletal: Negative for back pain, joint pain and myalgias.  Skin: Negative for itching and rash.  Neurological: Positive for headaches. Negative for dizziness and tingling.  Endo/Heme/Allergies: Negative for environmental allergies and polydipsia. Does not bruise/bleed easily.       Positive for hot flashes the last  6 mos   Psychiatric/Behavioral: Positive for depression. The patient is nervous/anxious. The patient does not have insomnia.     Past Medical History:  Past Medical History:  Diagnosis Date  . Fibroids   . Melanoma ([place]) 2001   upper abdomen in the 90s/ Dr Nehemiah Massed  . Menorrhagia with regular cycle   . Ovarian cyst     Past Surgical History:   Past Surgical History:  Procedure Laterality Date  . Excision of melanoma    . INTRAUTERINE DEVICE (IUD) INSERTION  2009  . IUD REMOVAL  2014  . WISDOM TOOTH EXTRACTION       Obstetric History:  OB History  Gravida Para Term Preterm AB Living  2 2 2     2   SAB TAB Ectopic Multiple Live Births          2    # Outcome Date GA Lbr Len/2nd Weight Sex Delivery Anes PTL Lv  2 Term 11/01/07   7 lb 5 oz (3.317 kg) M Vag-Spont   LIV  1 Term 03/18/04   7 lb 9 oz (3.43 kg) F Vag-Spont   LIV    Family History:  Family History  Problem Relation Age of Onset  . Hypertension Father   . Diabetes Father   . Hyperlipidemia Brother   . Hypertension Brother   . Colon cancer Paternal Grandfather 76  . Colon cancer Paternal Uncle 49  . Breast cancer Neg Hx     Social History:  Social History   Socioeconomic History  . Marital status: Married    Spouse name: Gaspar Bidding  . Number of children: 2  . Years of education: Not on file  . Highest education level: Not on file  Occupational History  . Occupation: Medical illustrator  Social Needs  . Financial resource strain: Not on file  . Food insecurity    Worry: Not on file    Inability: Not on file  . Transportation needs    Medical: Not on file    Non-medical: Not on file  Tobacco Use  . Smoking status: Never Smoker  . Smokeless tobacco: Never Used  Substance and Sexual Activity  . Alcohol use: Yes    Comment: 1 glass of wine/month  . Drug use: No  . Sexual activity: Yes    Partners: Male    Birth control/protection: Surgical    Comment: vasectomy  Lifestyle  . Physical activity    Days per week: 2 days    Minutes per session: 60 min  . Stress: Only a little  Relationships  . Social Herbalist on phone: Not on file    Gets together: Not on file    Attends religious service: Not on file    Active member of club or organization: Not on file    Attends meetings of clubs or organizations: Not on file    Relationship  status: Not on file  . Intimate partner violence    Fear of current or ex partner: Not on file    Emotionally abused: Not on file    Physically abused: Not on file    Forced sexual activity: Not on file  Other Topics Concern  . Not on file  Social History Narrative   Married.   2 children.   Works in Insurance underwriter.   Enjoys spending time with family, walking, exercising.    Allergies:  Allergies  Allergen Reactions  . Promethazine Other (See Comments)    uncontrollable reflex  Medications: Current Outpatient Medications:  .  naproxen sodium (ALEVE) 220 MG tablet, Take by mouth., Disp: , Rfl:  Physical Exam Vitals: BP 112/60   Pulse 70   Ht 5\' 4"  (1.626 m)   Wt 153 lb (69.4 kg)   LMP 08/30/2018 (Exact Date)   BMI 26.26 kg/m  General: WF in NAD HEENT: normocephalic, anicteric Thyroid: no enlargement, no palpable nodules Pulmonary: No increased work of breathing, CTAB Cardiovascular: RRR without murmur Breast: Breast symmetrical, no tenderness, no palpable nodules or masses, no skin or nipple retraction present, no nipple discharge.  No axillary, infraclavicular, or supraclavicular lymphadenopathy. Abdomen: soft, non-distended.  Umbilicus without lesions.  No hepatomegaly. Fundal height 1/2 between the umbilicus and SP.  No evidence of hernia  Genitourinary:  External: Normal external female genitalia.  Normal urethral meatus, normal Bartholin's and Skene's glands.    Vagina: Normal vaginal mucosa, no evidence of prolapse.    Cervix: Grossly normal in appearance, no bleeding, posterior  Uterus: enlarged 16 week size, irregular uterus on the right, AV, mild tenderness  Adnexa: ovaries non-enlarged, no adnexal masses-difficult exam due to enlarged uterus.  Rectal: no masses, hemoccult positive  Lymphatic: no evidence of inguinal lymphadenopathy Extremities: no edema, erythema, or tenderness Neurologic: Grossly intact Psychiatric: mood appropriate, affect full    Assessment/ Plan: 48 y.o. well woman exam with 16 week size uterus due to symptomatic fibroids.   There seems to be some growth in the fibroids.  Discussed options, other than hysterectomy, in reducing bleeding, I.e., NSAIDS, oral norethindrone,  Depo Provera. She would like to try NSAIDS for now. RX for naprosyn sent to pharmacy.  Given written info on norethindrone  Recommend taking vitamins with iron. Positive hemoccult of stool-has not see blood in stool recently due to straining to have BM  Will refer to GI Screening for cervical cancer: Pap done Routine screening for cholesterol/ diabetes UTD Recommend taking vitamin D3 supplements Breast cancer screening: continue self breast exams and annual mammograms. Mammogram is UTD.  Dalia Heading, CNM

## 2018-09-23 ENCOUNTER — Encounter: Payer: Self-pay | Admitting: Certified Nurse Midwife

## 2018-09-23 ENCOUNTER — Ambulatory Visit (INDEPENDENT_AMBULATORY_CARE_PROVIDER_SITE_OTHER): Payer: BC Managed Care – PPO | Admitting: Certified Nurse Midwife

## 2018-09-23 ENCOUNTER — Other Ambulatory Visit: Payer: Self-pay

## 2018-09-23 VITALS — BP 112/60 | HR 70 | Ht 64.0 in | Wt 153.0 lb

## 2018-09-23 DIAGNOSIS — D219 Benign neoplasm of connective and other soft tissue, unspecified: Secondary | ICD-10-CM

## 2018-09-23 DIAGNOSIS — Z124 Encounter for screening for malignant neoplasm of cervix: Secondary | ICD-10-CM

## 2018-09-23 DIAGNOSIS — Z01419 Encounter for gynecological examination (general) (routine) without abnormal findings: Secondary | ICD-10-CM

## 2018-09-23 DIAGNOSIS — N920 Excessive and frequent menstruation with regular cycle: Secondary | ICD-10-CM

## 2018-09-23 DIAGNOSIS — R195 Other fecal abnormalities: Secondary | ICD-10-CM

## 2018-09-23 DIAGNOSIS — Z1211 Encounter for screening for malignant neoplasm of colon: Secondary | ICD-10-CM

## 2018-09-23 LAB — HEMOCCULT GUIAC POC 1CARD (OFFICE): Fecal Occult Blood, POC: POSITIVE — AB

## 2018-09-23 MED ORDER — NAPROXEN 500 MG PO TABS: ORAL_TABLET | ORAL | 1 refills | Status: DC

## 2018-09-23 NOTE — Patient Instructions (Signed)
Norethindrone acetate (hormone replacement) What is this medicine? NORETHINDRONE ACETATE (nor eth IN drone AS e tate) is a female hormone. This medicine is used to treat endometriosis, uterine bleeding caused by abnormal hormone levels, and secondary amenorrhea. Secondary amenorrhea is when a woman stops getting menstrual periods due to low levels of certain female hormones. This medicine may be used for other purposes; ask your health care provider or pharmacist if you have questions. COMMON BRAND NAME(S): Aygestin What should I tell my health care provider before I take this medicine? They need to know if you have any of these conditions:  blood vessel disease or blood clots  breast, cervical, or vaginal cancer  diabetes  heart disease  kidney disease  liver disease  mental depression  migraine  seizures  stroke  vaginal bleeding  an unusual or allergic reaction to norethindrone, other medicines, foods, dyes, or preservatives  pregnant or trying to get pregnant  breast-feeding How should I use this medicine? Take this medicine by mouth with a glass of water. You may take this medicine with or without food. Follow the directions on the prescription label. Take this medicine at the same time each day. Do not take your medicine more often than directed. A patient information sheet will be given with each prescription and refill. Read this sheet carefully each time. The sheet may change frequently. Talk to your pediatrician regarding the use of this medicine in children. Special care may be needed. Overdosage: If you think you have taken too much of this medicine contact a poison control center or emergency room at once. NOTE: This medicine is only for you. Do not share this medicine with others. What if I miss a dose? If you miss a dose, take it as soon as you can. If it is almost time for your next dose, take only that dose. Do not take double or extra doses. What may  interact with this medicine? Do not take this medicine with any of the following medications:  amprenavir or fosamprenavir  bosentan This medicine may also interact with the following medications:  antibiotics or medicines for infections, especially rifampin, rifabutin, rifapentine, and griseofulvin, and possibly penicillins or tetracyclines  aprepitant  barbiturate medicines, such as phenobarbital  carbamazepine  felbamate  modafinil  oxcarbazepine  phenytoin  ritonavir or other medicines for HIV infection or AIDS  St. John's wort  topiramate This list may not describe all possible interactions. Give your health care provider a list of all the medicines, herbs, non-prescription drugs, or dietary supplements you use. Also tell them if you smoke, drink alcohol, or use illegal drugs. Some items may interact with your medicine. What should I watch for while using this medicine? Visit your doctor or health care professional for regular checks on your progress. You will need a regular breast and pelvic exam and Pap smear while on this medicine. If you have any reason to think you are pregnant, stop taking this medicine right away and contact your doctor or health care professional. If you are taking this medicine for hormone related problems, it may take several cycles of use to see improvement in your condition. What side effects may I notice from receiving this medicine? Side effects that you should report to your doctor or health care professional as soon as possible:  breast tenderness or discharge  pain in the abdomen, chest, groin or leg  severe headache  skin rash, itching, or hives  sudden shortness of breath  unusually weak or  tired  vision or speech problems  yellowing of skin or eyes Side effects that usually do not require medical attention (report to your doctor or health care professional if they continue or are bothersome):  changes in sexual  desire  change in menstrual flow  facial hair growth  fluid retention and swelling  headache  irritability  nausea  weight gain or loss This list may not describe all possible side effects. Call your doctor for medical advice about side effects. You may report side effects to FDA at 1-800-FDA-1088. Where should I keep my medicine? Keep out of the reach of children. Store at room temperature between 15 and 30 degrees C (59 and 86 degrees F). Throw away any unused medicine after the expiration date. NOTE: This sheet is a summary. It may not cover all possible information. If you have questions about this medicine, talk to your doctor, pharmacist, or health care provider.  2020 Elsevier/Gold Standard (2007-09-08 14:38:36)

## 2018-09-24 ENCOUNTER — Telehealth: Payer: Self-pay

## 2018-09-24 ENCOUNTER — Other Ambulatory Visit: Payer: Self-pay

## 2018-09-24 DIAGNOSIS — Z800 Family history of malignant neoplasm of digestive organs: Secondary | ICD-10-CM

## 2018-09-24 DIAGNOSIS — Z8371 Family history of colonic polyps: Secondary | ICD-10-CM

## 2018-09-24 DIAGNOSIS — R195 Other fecal abnormalities: Secondary | ICD-10-CM

## 2018-09-24 DIAGNOSIS — Z1211 Encounter for screening for malignant neoplasm of colon: Secondary | ICD-10-CM

## 2018-09-24 NOTE — Telephone Encounter (Signed)
Gastroenterology Pre-Procedure Review  Request Date: 10/10/18 Requesting Physician: Dr. Marius Ditch  PATIENT REVIEW QUESTIONS: The patient responded to the following health history questions as indicated:    1. Are you having any GI issues? yes (blood in stool and constipation) 2. Do you have a personal history of Polyps? no 3. Do you have a family history of Colon Cancer or Polyps? yes (grandfather (Paternal) colon cancer, mother and father colon polyps) 4. Diabetes Mellitus? no 5. Joint replacements in the past 12 months?no 6. Major health problems in the past 3 months?no 7. Any artificial heart valves, MVP, or defibrillator?no    MEDICATIONS & ALLERGIES:    Patient reports the following regarding taking any anticoagulation/antiplatelet therapy:   Plavix, Coumadin, Eliquis, Xarelto, Lovenox, Pradaxa, Brilinta, or Effient? no Aspirin? no  Patient confirms/reports the following medications:  Current Outpatient Medications  Medication Sig Dispense Refill  . naproxen (NAPROSYN) 500 MG tablet Take one twice a day at the start of menses through the heaviest flow days 50 tablet 1   No current facility-administered medications for this visit.     Patient confirms/reports the following allergies:  Allergies  Allergen Reactions  . Promethazine Other (See Comments)    uncontrollable reflex    No orders of the defined types were placed in this encounter.   AUTHORIZATION INFORMATION Primary Insurance: 1D#: Group #:  Secondary Insurance: 1D#: Group #:  SCHEDULE INFORMATION: Date: 10/10/18 Time: Location:[facility]

## 2018-09-29 LAB — IGP,RFX APTIMA HPV ALL PTH

## 2018-10-07 ENCOUNTER — Other Ambulatory Visit: Payer: Self-pay

## 2018-10-07 ENCOUNTER — Other Ambulatory Visit
Admission: RE | Admit: 2018-10-07 | Discharge: 2018-10-07 | Disposition: A | Discharge status: Home / Self Care | Payer: BC Managed Care – PPO | Source: Ambulatory Visit | Attending: Gastroenterology | Admitting: Gastroenterology

## 2018-10-07 DIAGNOSIS — Z20828 Contact with and (suspected) exposure to other viral communicable diseases: Secondary | ICD-10-CM | POA: Insufficient documentation

## 2018-10-07 DIAGNOSIS — Z01812 Encounter for preprocedural laboratory examination: Secondary | ICD-10-CM | POA: Insufficient documentation

## 2018-10-07 LAB — SARS CORONAVIRUS 2 (TAT 6-24 HRS): SARS Coronavirus 2: NEGATIVE

## 2018-10-10 ENCOUNTER — Ambulatory Visit: Payer: BC Managed Care – PPO | Admitting: Certified Registered"

## 2018-10-10 ENCOUNTER — Ambulatory Visit
Admission: RE | Admit: 2018-10-10 | Discharge: 2018-10-10 | Disposition: A | Discharge status: Home / Self Care | Payer: BC Managed Care – PPO | Attending: Gastroenterology | Admitting: Gastroenterology

## 2018-10-10 ENCOUNTER — Encounter: Payer: Self-pay | Admitting: *Deleted

## 2018-10-10 ENCOUNTER — Encounter
Admission: RE | Disposition: A | Discharge status: Home / Self Care | Payer: Self-pay | Source: Home / Self Care | Attending: Gastroenterology

## 2018-10-10 DIAGNOSIS — Z791 Long term (current) use of non-steroidal anti-inflammatories (NSAID): Secondary | ICD-10-CM | POA: Diagnosis not present

## 2018-10-10 DIAGNOSIS — Z8371 Family history of colonic polyps: Secondary | ICD-10-CM | POA: Diagnosis not present

## 2018-10-10 DIAGNOSIS — Z800 Family history of malignant neoplasm of digestive organs: Secondary | ICD-10-CM | POA: Insufficient documentation

## 2018-10-10 DIAGNOSIS — R195 Other fecal abnormalities: Secondary | ICD-10-CM | POA: Diagnosis not present

## 2018-10-10 DIAGNOSIS — Z85820 Personal history of malignant melanoma of skin: Secondary | ICD-10-CM | POA: Insufficient documentation

## 2018-10-10 DIAGNOSIS — K6389 Other specified diseases of intestine: Secondary | ICD-10-CM | POA: Diagnosis not present

## 2018-10-10 DIAGNOSIS — D120 Benign neoplasm of cecum: Secondary | ICD-10-CM | POA: Insufficient documentation

## 2018-10-10 DIAGNOSIS — K635 Polyp of colon: Secondary | ICD-10-CM

## 2018-10-10 DIAGNOSIS — Z888 Allergy status to other drugs, medicaments and biological substances status: Secondary | ICD-10-CM | POA: Diagnosis not present

## 2018-10-10 DIAGNOSIS — Z1211 Encounter for screening for malignant neoplasm of colon: Secondary | ICD-10-CM

## 2018-10-10 DIAGNOSIS — K644 Residual hemorrhoidal skin tags: Secondary | ICD-10-CM | POA: Insufficient documentation

## 2018-10-10 HISTORY — PX: COLONOSCOPY WITH PROPOFOL: SHX5780

## 2018-10-10 LAB — POCT PREGNANCY, URINE: Preg Test, Ur: NEGATIVE

## 2018-10-10 SURGERY — COLONOSCOPY WITH PROPOFOL
Anesthesia: General

## 2018-10-10 MED ORDER — EPHEDRINE SULFATE 50 MG/ML IJ SOLN
INTRAMUSCULAR | Status: AC
  Filled 2018-10-10: qty 1

## 2018-10-10 MED ORDER — SODIUM CHLORIDE 0.9 % IV SOLN
INTRAVENOUS | Status: DC
  Administered 2018-10-10: 09:00:00 via INTRAVENOUS
  Administered 2018-10-10: 1000 mL via INTRAVENOUS

## 2018-10-10 MED ORDER — GLYCOPYRROLATE 0.2 MG/ML IJ SOLN
INTRAMUSCULAR | Status: AC
  Filled 2018-10-10: qty 5

## 2018-10-10 MED ORDER — PHENYLEPHRINE HCL (PRESSORS) 10 MG/ML IV SOLN
INTRAVENOUS | Status: AC
  Filled 2018-10-10: qty 1

## 2018-10-10 MED ORDER — LIDOCAINE HCL (PF) 2 % IJ SOLN
INTRAMUSCULAR | Status: AC
  Filled 2018-10-10: qty 50

## 2018-10-10 MED ORDER — PROPOFOL 500 MG/50ML IV EMUL
INTRAVENOUS | Status: AC
  Filled 2018-10-10: qty 400

## 2018-10-10 MED ORDER — PROPOFOL 10 MG/ML IV BOLUS
INTRAVENOUS | Status: DC | PRN
  Administered 2018-10-10 (×7): 50 mg via INTRAVENOUS

## 2018-10-10 MED ORDER — PROPOFOL 10 MG/ML IV BOLUS
INTRAVENOUS | Status: AC
  Filled 2018-10-10: qty 40

## 2018-10-10 MED ORDER — PROPOFOL 500 MG/50ML IV EMUL
INTRAVENOUS | Status: AC
  Filled 2018-10-10: qty 50

## 2018-10-10 NOTE — Anesthesia Preprocedure Evaluation (Signed)
Anesthesia Evaluation  Patient identified by MRN, date of birth, ID band Patient awake    Reviewed: Allergy & Precautions, H&P , NPO status , Patient's Chart, lab work & pertinent test results, reviewed documented beta blocker date and time   Airway Mallampati: II   Neck ROM: full    Dental  (+) Poor Dentition   Pulmonary neg pulmonary ROS,    Pulmonary exam normal        Cardiovascular Exercise Tolerance: Good negative cardio ROS Normal cardiovascular exam Rhythm:regular Rate:Normal     Neuro/Psych negative neurological ROS  negative psych ROS   GI/Hepatic negative GI ROS, Neg liver ROS,   Endo/Other  negative endocrine ROS  Renal/GU negative Renal ROS  negative genitourinary   Musculoskeletal   Abdominal   Peds  Hematology negative hematology ROS (+)   Anesthesia Other Findings Past Medical History: No date: Fibroids 2001: Melanoma ([place])     Comment:  upper abdomen in the 90s/ Dr Nehemiah Massed No date: Menorrhagia with regular cycle No date: Ovarian cyst Past Surgical History: No date: Excision of melanoma 2009: INTRAUTERINE DEVICE (IUD) INSERTION 2014: IUD REMOVAL No date: WISDOM TOOTH EXTRACTION BMI    Body Mass Index: 26.26 kg/m     Reproductive/Obstetrics negative OB ROS                             Anesthesia Physical Anesthesia Plan  ASA: II  Anesthesia Plan: General   Post-op Pain Management:    Induction:   PONV Risk Score and Plan:   Airway Management Planned:   Additional Equipment:   Intra-op Plan:   Post-operative Plan:   Informed Consent: I have reviewed the patients History and Physical, chart, labs and discussed the procedure including the risks, benefits and alternatives for the proposed anesthesia with the patient or authorized representative who has indicated his/her understanding and acceptance.     Dental Advisory Given  Plan Discussed with:  CRNA  Anesthesia Plan Comments:         Anesthesia Quick Evaluation

## 2018-10-10 NOTE — Transfer of Care (Signed)
Immediate Anesthesia Transfer of Care Note  Patient: Cheryl Schmidt  Procedure(s) Performed: COLONOSCOPY WITH PROPOFOL (N/A )  Patient Location: Endoscopy Unit  Anesthesia Type:General  Level of Consciousness: drowsy and responds to stimulation  Airway & Oxygen Therapy: Patient Spontanous Breathing and Patient connected to face mask oxygen  Post-op Assessment: Report given to RN and Post -op Vital signs reviewed and stable  Post vital signs: Reviewed and stable  Last Vitals:  Vitals Value Taken Time  BP 87/58 10/10/18 0910  Temp    Pulse 76 10/10/18 0910  Resp 12 10/10/18 0910  SpO2 99 % 10/10/18 0910  Vitals shown include unvalidated device data.  Last Pain:  Vitals:   10/10/18 0818  TempSrc: Tympanic  PainSc: 0-No pain         Complications: No apparent anesthesia complications

## 2018-10-10 NOTE — Anesthesia Post-op Follow-up Note (Signed)
Anesthesia QCDR form completed.        

## 2018-10-10 NOTE — H&P (Signed)
Cephas Darby, MD [address]  [place]  [place], [place] [address]  Main: [phone]  Fax: [phone] Pager: [phone]  Primary Care Physician:  Pleas Koch, NP Primary Gastroenterologist:  Dr. Cephas Darby  Pre-Procedure History & Physical: HPI:  Cheryl Schmidt is a 48 y.o. female is here for an colonoscopy.   Past Medical History:  Diagnosis Date  . Fibroids   . Melanoma ([place]) 2001   upper abdomen in the 90s/ Dr Nehemiah Massed  . Menorrhagia with regular cycle   . Ovarian cyst     Past Surgical History:  Procedure Laterality Date  . Excision of melanoma    . INTRAUTERINE DEVICE (IUD) INSERTION  2009  . IUD REMOVAL  2014  . WISDOM TOOTH EXTRACTION      Prior to Admission medications   Medication Sig Start Date End Date Taking? Authorizing Provider  naproxen (NAPROSYN) 500 MG tablet Take one twice a day at the start of menses through the heaviest flow days 09/23/18  Yes Dalia Heading, CNM    Allergies as of 09/25/2018 - Review Complete 09/23/2018  Allergen Reaction Noted  . Promethazine Other (See Comments) 06/04/2014    Family History  Problem Relation Age of Onset  . Hypertension Father   . Diabetes Father   . Hyperlipidemia Brother   . Hypertension Brother   . Colon cancer Paternal Grandfather 69  . Colon cancer Paternal Uncle 59  . Breast cancer Neg Hx     Social History   Socioeconomic History  . Marital status: Married    Spouse name: Gaspar Bidding  . Number of children: 2  . Years of education: Not on file  . Highest education level: Not on file  Occupational History  . Occupation: Medical illustrator  Social Needs  . Financial resource strain: Not on file  . Food insecurity    Worry: Not on file    Inability: Not on file  . Transportation needs    Medical: Not on file    Non-medical: Not on file  Tobacco Use  . Smoking status: Never Smoker  . Smokeless tobacco: Never Used  Substance and Sexual Activity  . Alcohol use: Yes   Comment: 1 glass of wine/month  . Drug use: Never  . Sexual activity: Yes    Partners: Male    Birth control/protection: Surgical    Comment: vasectomy  Lifestyle  . Physical activity    Days per week: 2 days    Minutes per session: 60 min  . Stress: Only a little  Relationships  . Social Herbalist on phone: Not on file    Gets together: Not on file    Attends religious service: Not on file    Active member of club or organization: Not on file    Attends meetings of clubs or organizations: Not on file    Relationship status: Not on file  . Intimate partner violence    Fear of current or ex partner: Not on file    Emotionally abused: Not on file    Physically abused: Not on file    Forced sexual activity: Not on file  Other Topics Concern  . Not on file  Social History Narrative   Married.   2 children.   Works in Insurance underwriter.   Enjoys spending time with family, walking, exercising.    Review of Systems: See HPI, otherwise negative ROS  Physical Exam: BP 116/78   Pulse 61   Temp  98.1 F (36.7 C) (Tympanic)   Resp 16   Ht 5\' 4"  (1.626 m)   Wt 69.4 kg   LMP 09/27/2018   SpO2 100%   BMI 26.26 kg/m  General:   Alert,  pleasant and cooperative in NAD Head:  Normocephalic and atraumatic. Neck:  Supple; no masses or thyromegaly. Lungs:  Clear throughout to auscultation.    Heart:  Regular rate and rhythm. Abdomen:  Soft, nontender and nondistended. Normal bowel sounds, without guarding, and without rebound.   Neurologic:  Alert and  oriented x4;  grossly normal neurologically.  Impression/Plan: Cheryl Schmidt is here for a colonoscopy to be performed for positive fecal occult blood  Risks, benefits, limitations, and alternatives regarding  colonoscopy have been reviewed with the patient.  Questions have been answered.  All parties agreeable.   Sherri Sear, MD  10/10/2018, 8:37 AM

## 2018-10-10 NOTE — Op Note (Signed)
[facility] Patient Name: Cheryl Schmidt Procedure Date: 10/10/2018 8:10 AM MRN: [id number] Account #: [id number] Date of Birth: 1971/01/01 Admit Type: Outpatient Age: 48 Room: [facility] ENDO ROOM 3 Gender: Female Note Status: Finalized Procedure:            Colonoscopy Indications:          This is the patient's first colonoscopy, Family history                        of colon cancer in multiple second-degree relatives,                        Positive fecal immunochemical test Providers:            Lin Landsman MD, MD Referring MD:         Pleas Koch (Referring MD) Medicines:            Monitored Anesthesia Care Complications:        No immediate complications. Estimated blood loss: None. Procedure:            Pre-Anesthesia Assessment:                       - Prior to the procedure, a History and Physical was                        performed, and patient medications and allergies were                        reviewed. The patient is competent. The risks and                        benefits of the procedure and the sedation options and                        risks were discussed with the patient. All questions                        were answered and informed consent was obtained.                        Patient identification and proposed procedure were                        verified by the physician, the nurse, the                        anesthesiologist, the anesthetist and the technician in                        the pre-procedure area in the procedure room in the                        endoscopy suite. Mental Status Examination: alert and                        oriented. Airway Examination: normal oropharyngeal                        airway and neck mobility. Respiratory Examination:  clear to auscultation. CV Examination: normal.                        Prophylactic Antibiotics: The patient does not require                 prophylactic antibiotics. Prior Anticoagulants: The                        patient has taken no previous anticoagulant or                        antiplatelet agents. ASA Grade Assessment: II - A                        patient with mild systemic disease. After reviewing the                        risks and benefits, the patient was deemed in                        satisfactory condition to undergo the procedure. The                        anesthesia plan was to use monitored anesthesia care                        (MAC). Immediately prior to administration of                        medications, the patient was re-assessed for adequacy                        to receive sedatives. The heart rate, respiratory rate,                        oxygen saturations, blood pressure, adequacy of                        pulmonary ventilation, and response to care were                        monitored throughout the procedure. The physical status                        of the patient was re-assessed after the procedure.                       After obtaining informed consent, the colonoscope was                        passed under direct vision. Throughout the procedure,                        the patient's blood pressure, pulse, and oxygen                        saturations were monitored continuously. The                        Colonoscope was introduced through the anus and  advanced to the the cecum, identified by appendiceal                        orifice and ileocecal valve. The colonoscopy was                        performed without difficulty. The patient tolerated the                        procedure well. The quality of the bowel preparation                        was evaluated using the BBPS [facility] Bowel Preparation                        Scale) with scores of: Right Colon = 3, Transverse                        Colon = 3 and Left Colon = 3 (entire mucosa seen  well                        with no residual staining, small fragments of stool or                        opaque liquid). The total BBPS score equals 9. Findings:      The perianal and digital rectal examinations were normal. Pertinent       negatives include normal sphincter tone and no palpable rectal lesions.      A 6 mm polyp was found in the cecum. The polyp was flat. The polyp was       removed with a cold snare. Resection and retrieval were complete.      A scattered area of mildly nodular/polypoid mucosa was found in the       transverse colon. Biopsies were taken with a cold forceps for histology.      Non-bleeding external hemorrhoids were found during retroflexion. The       hemorrhoids were mild. Impression:           - One 6 mm polyp in the cecum, removed with a cold                        snare. Resected and retrieved.                       - Nodular mucosa in the transverse colon. Biopsied.                       - Non-bleeding external hemorrhoids. Recommendation:       - Discharge patient to home (with escort).                       - Resume regular diet today.                       - Continue present medications.                       - Await pathology results.                       -  Repeat colonoscopy in 5-10 years for surveillance                        based on pathology results. Procedure Code(s):    --- Professional ---                       [id number], Colonoscopy, flexible; with removal of tumor(s),                        polyp(s), or other lesion(s) by snare technique                       45380, 37, Colonoscopy, flexible; with biopsy, single                        or multiple Diagnosis Code(s):    --- Professional ---                       K63.5, Polyp of colon                       K63.89, Other specified diseases of intestine                       K64.4, Residual hemorrhoidal skin tags                       Z80.0, Family history of malignant neoplasm of                         digestive organs                       R19.5, Other fecal abnormalities CPT copyright 2019 American Medical Association. All rights reserved. The codes documented in this report are preliminary and upon coder review may  be revised to meet current compliance requirements. Dr. Ulyess Mort Lin Landsman MD, MD 10/10/2018 9:09:35 AM This report has been signed electronically. Number of Addenda: 0 Note Initiated On: 10/10/2018 8:10 AM Scope Withdrawal Time: 0 hours 16 minutes 2 seconds  Total Procedure Duration: 0 hours 19 minutes 4 seconds  Estimated Blood Loss: Estimated blood loss: none.      [facility]

## 2018-10-13 ENCOUNTER — Encounter: Payer: Self-pay | Admitting: Gastroenterology

## 2018-10-13 LAB — SURGICAL PATHOLOGY

## 2018-10-27 NOTE — Anesthesia Postprocedure Evaluation (Signed)
Anesthesia Post Note  Patient: Cheryl Schmidt  Procedure(s) Performed: COLONOSCOPY WITH PROPOFOL (N/A )  Patient location during evaluation: PACU Anesthesia Type: General Level of consciousness: awake and alert Pain management: pain level controlled Vital Signs Assessment: post-procedure vital signs reviewed and stable Respiratory status: spontaneous breathing, nonlabored ventilation, respiratory function stable and patient connected to nasal cannula oxygen Cardiovascular status: blood pressure returned to baseline and stable Postop Assessment: no apparent nausea or vomiting Anesthetic complications: no     Last Vitals:  Vitals:   10/10/18 0940 10/10/18 0952  BP: (!) 99/57 107/68  Pulse: 64 63  Resp: 14 13  Temp:    SpO2: 100% 100%    Last Pain:  Vitals:   10/10/18 0910  TempSrc: Tympanic  PainSc:                  Molli Barrows

## 2019-02-16 ENCOUNTER — Ambulatory Visit: Payer: BC Managed Care – PPO | Attending: Internal Medicine

## 2019-02-16 DIAGNOSIS — Z20822 Contact with and (suspected) exposure to covid-19: Secondary | ICD-10-CM

## 2019-02-16 DIAGNOSIS — Z20828 Contact with and (suspected) exposure to other viral communicable diseases: Secondary | ICD-10-CM | POA: Diagnosis not present

## 2019-02-17 LAB — NOVEL CORONAVIRUS, NAA: SARS-CoV-2, NAA: NOT DETECTED

## 2019-03-05 ENCOUNTER — Encounter: Payer: Self-pay | Admitting: Internal Medicine

## 2019-03-05 ENCOUNTER — Other Ambulatory Visit: Payer: Self-pay

## 2019-03-05 ENCOUNTER — Ambulatory Visit (INDEPENDENT_AMBULATORY_CARE_PROVIDER_SITE_OTHER): Payer: BC Managed Care – PPO | Admitting: Internal Medicine

## 2019-03-05 VITALS — BP 110/76 | HR 61 | Temp 98.1°F | Wt 157.0 lb

## 2019-03-05 DIAGNOSIS — J329 Chronic sinusitis, unspecified: Secondary | ICD-10-CM | POA: Diagnosis not present

## 2019-03-05 DIAGNOSIS — B9789 Other viral agents as the cause of diseases classified elsewhere: Secondary | ICD-10-CM | POA: Diagnosis not present

## 2019-03-05 MED ORDER — PREDNISONE 10 MG PO TABS: ORAL_TABLET | ORAL | 0 refills | Status: DC

## 2019-03-05 NOTE — Progress Notes (Signed)
HPI  Pt presents to the clinic today with c/o right ear pain. This started 1 week ago. She describes the pain as. The pain radiates into the right side of her face and upper teeth. This area is tender to touch. She reports associated runny nose with clear mucous and ringing in the ears but denies headache, nasal congestion, sore throat, loss of taste/smell, cough or SOB. She denies fever, chills or body aches. She has tried Ibuprofen, nasal spray and Sudafed with minimal relief. She has not had sick contacts or exposure to COVID that she is aware of.   Review of Systems     Past Medical History:  Diagnosis Date  . Fibroids   . Melanoma ([place]) 2001   upper abdomen in the 90s/ Dr Nehemiah Massed  . Menorrhagia with regular cycle   . Ovarian cyst     Family History  Problem Relation Age of Onset  . Hypertension Father   . Diabetes Father   . Hyperlipidemia Brother   . Hypertension Brother   . Colon cancer Paternal Grandfather 70  . Colon cancer Paternal Uncle 31  . Breast cancer Neg Hx     Social History   Socioeconomic History  . Marital status: Married    Spouse name: Gaspar Bidding  . Number of children: 2  . Years of education: Not on file  . Highest education level: Not on file  Occupational History  . Occupation: Medical illustrator  Tobacco Use  . Smoking status: Never Smoker  . Smokeless tobacco: Never Used  Substance and Sexual Activity  . Alcohol use: Yes    Comment: 1 glass of wine/month  . Drug use: Never  . Sexual activity: Yes    Partners: Male    Birth control/protection: Surgical    Comment: vasectomy  Other Topics Concern  . Not on file  Social History Narrative   Married.   2 children.   Works in Insurance underwriter.   Enjoys spending time with family, walking, exercising.   Social Determinants of Health   Financial Resource Strain:   . Difficulty of Paying Living Expenses: Not on file  Food Insecurity:   . Worried About Charity fundraiser in the Last Year: Not on file   . Ran Out of Food in the Last Year: Not on file  Transportation Needs:   . Lack of Transportation (Medical): Not on file  . Lack of Transportation (Non-Medical): Not on file  Physical Activity:   . Days of Exercise per Week: Not on file  . Minutes of Exercise per Session: Not on file  Stress:   . Feeling of Stress : Not on file  Social Connections:   . Frequency of Communication with Friends and Family: Not on file  . Frequency of Social Gatherings with Friends and Family: Not on file  . Attends Religious Services: Not on file  . Active Member of Clubs or Organizations: Not on file  . Attends Archivist Meetings: Not on file  . Marital Status: Not on file  Intimate Partner Violence:   . Fear of Current or Ex-Partner: Not on file  . Emotionally Abused: Not on file  . Physically Abused: Not on file  . Sexually Abused: Not on file    Allergies  Allergen Reactions  . Promethazine Other (See Comments)    uncontrollable reflex     Constitutional:  Denies headache, fatigue, fever or abrupt weight changes.  HEENT:  Positive sinus pressure, ear pain, and runny nose. Denies eye  redness, wax buildup, nasal congestion or sore throat. Respiratory: Denies cough, difficulty breathing or shortness of breath.  Cardiovascular: Denies chest pain, chest tightness, palpitations or swelling in the hands or feet.   No other specific complaints in a complete review of systems (except as listed in HPI above).  Objective:   BP 110/76   Pulse 61   Temp 98.1 F (36.7 C) (Temporal)   Wt 157 lb (71.2 kg)   LMP 02/26/2019   SpO2 98%   BMI 26.95 kg/m   General: Appears her stated age, well developed, well nourished in NAD. HEENT: Head: normal shape and size, right maxillary sinus tenderness noted; Eyes: sclera white, no icterus, conjunctiva pink; Ears: Tm's gray and intact, normal light reflex; Nose: mucosa boggy and moist, septum midline;  Neck:  Right anterior cervical adenopathy  noted Cardiovascular: Normal rate and rhythm.  Pulmonary/Chest: Normal effort and positive vesicular breath sounds. No respiratory distress. No wheezes, rales or ronchi noted.       Assessment & Plan:   Acute Viral Sinusitis  Can use a Neti Pot which can be purchased from your local drug store. RX for Pred Taper x 6 days Stop Ibuprofen and Sudafed No indication for abx at this time but if symptoms persist, consider antibiotic on Monday  RTC as needed or if symptoms persist. Webb Silversmith, NP  This visit occurred during the SARS-CoV-2 public health emergency.  Safety protocols were in place, including screening questions prior to the visit, additional usage of staff PPE, and extensive cleaning of exam room while observing appropriate contact time as indicated for disinfecting solutions.

## 2019-03-05 NOTE — Patient Instructions (Signed)

## 2019-06-07 ENCOUNTER — Other Ambulatory Visit: Payer: Self-pay

## 2019-06-07 ENCOUNTER — Emergency Department: Payer: BC Managed Care – PPO

## 2019-06-07 ENCOUNTER — Emergency Department
Admission: EM | Admit: 2019-06-07 | Discharge: 2019-06-07 | Disposition: A | Discharge status: Home / Self Care | Payer: BC Managed Care – PPO | Attending: Emergency Medicine | Admitting: Emergency Medicine

## 2019-06-07 DIAGNOSIS — D219 Benign neoplasm of connective and other soft tissue, unspecified: Secondary | ICD-10-CM | POA: Diagnosis not present

## 2019-06-07 DIAGNOSIS — R4189 Other symptoms and signs involving cognitive functions and awareness: Secondary | ICD-10-CM | POA: Diagnosis not present

## 2019-06-07 DIAGNOSIS — R61 Generalized hyperhidrosis: Secondary | ICD-10-CM | POA: Insufficient documentation

## 2019-06-07 DIAGNOSIS — R4182 Altered mental status, unspecified: Secondary | ICD-10-CM | POA: Diagnosis not present

## 2019-06-07 DIAGNOSIS — R000 Tachycardia, unspecified: Secondary | ICD-10-CM | POA: Diagnosis not present

## 2019-06-07 DIAGNOSIS — N939 Abnormal uterine and vaginal bleeding, unspecified: Secondary | ICD-10-CM | POA: Diagnosis not present

## 2019-06-07 DIAGNOSIS — D259 Leiomyoma of uterus, unspecified: Secondary | ICD-10-CM | POA: Diagnosis not present

## 2019-06-07 DIAGNOSIS — D251 Intramural leiomyoma of uterus: Secondary | ICD-10-CM | POA: Diagnosis not present

## 2019-06-07 LAB — BASIC METABOLIC PANEL
Anion gap: 9 (ref 5–15)
BUN: 13 mg/dL (ref 6–20)
CO2: 20 mmol/L — ABNORMAL LOW (ref 22–32)
Calcium: 9 mg/dL (ref 8.9–10.3)
Chloride: 107 mmol/L (ref 98–111)
Creatinine, Ser: 0.76 mg/dL (ref 0.44–1.00)
GFR calc Af Amer: >60 mL/min (ref >60)
GFR calc non Af Amer: >60 mL/min (ref >60)
Glucose, Bld: 122 mg/dL — ABNORMAL HIGH (ref 70–99)
Potassium: 3.2 mmol/L — ABNORMAL LOW (ref 3.5–5.1)
Sodium: 136 mmol/L (ref 135–145)

## 2019-06-07 LAB — CBC
HCT: 35.6 % — ABNORMAL LOW (ref 36.0–46.0)
Hemoglobin: 11.6 g/dL — ABNORMAL LOW (ref 12.0–15.0)
MCH: 26.4 pg (ref 26.0–34.0)
MCHC: 32.6 g/dL (ref 30.0–36.0)
MCV: 81.1 fL (ref 80.0–100.0)
Platelets: 365 10*3/uL (ref 150–400)
RBC: 4.39 MIL/uL (ref 3.87–5.11)
RDW: 15.6 % — ABNORMAL HIGH (ref 11.5–15.5)
WBC: 10.2 10*3/uL (ref 4.0–10.5)
nRBC: 0 % (ref 0.0–0.2)

## 2019-06-07 LAB — ABO/RH: ABO/RH(D): A POS

## 2019-06-07 LAB — HEMOGLOBIN AND HEMATOCRIT, BLOOD
HCT: 34.7 % — ABNORMAL LOW (ref 36.0–46.0)
Hemoglobin: 11.3 g/dL — ABNORMAL LOW (ref 12.0–15.0)

## 2019-06-07 LAB — HCG, QUANTITATIVE, PREGNANCY: hCG, Beta Chain, Quant, S: <1 m[IU]/mL (ref <5)

## 2019-06-07 LAB — PROTIME-INR
INR: 1 (ref 0.8–1.2)
Prothrombin Time: 13.5 seconds (ref 11.4–15.2)

## 2019-06-07 LAB — APTT: aPTT: <24 seconds — ABNORMAL LOW (ref 24–36)

## 2019-06-07 MED ORDER — TRANEXAMIC ACID 650 MG PO TABS: 1300.0000 mg | ORAL_TABLET | Freq: Three times a day (TID) | ORAL | 1 refills | Status: AC

## 2019-06-07 NOTE — ED Notes (Signed)
Pt answering questions asked by this RN at this time but still has her eyes closed. PT A&Ox4 and reports she is not in pain.

## 2019-06-07 NOTE — ED Notes (Signed)
E-signature pad unavailable at time of discharge. Discharge paperwork given a and teaching provided by this RN. PT and husband voice understanding of teaching.

## 2019-06-07 NOTE — ED Notes (Signed)
This RN with assistance by Jenetta Downer, RN attempted cathether insertion but did not get urine return, catheter removed. Dr. Jari Pigg informed

## 2019-06-07 NOTE — ED Notes (Signed)
Per blood bank the blood that pt received in the emergency transfusion cross matched with her blood type

## 2019-06-07 NOTE — Consult Note (Signed)
Reason for Consult:Acute Vaginal bleeding Referring Physician: Dr. Mariann Laster is an 49 y.o. female.  HPI: She presented today to the ER with acute vaginal bleeding which started at noon. She was filling a pad every 15 minutes. She became unresponsive in the waiting area and was resuscitated with IV fluids and uncrossed blood. Work up revealed that she has an enlarged fibroid uterus. Her vaginal bleeding diminished on it's own while she was in the ER. She has been stable for the last 4 hours.  She reports that she generally has a monthly menstrual cycle which lasts for 5 days. Her bleeding is heavy, but has never been this heavy. She had considered a hysterectomy in the past, but did not follow through with that plan. She does have pelvic pressure and has noticed that her abdomen seems to of grown.   Past Medical History:  Diagnosis Date  . Fibroids   . Melanoma ([place]) 2001   upper abdomen in the 90s/ Dr Nehemiah Massed  . Menorrhagia with regular cycle   . Ovarian cyst     Past Surgical History:  Procedure Laterality Date  . COLONOSCOPY WITH PROPOFOL N/A 10/10/2018   Procedure: COLONOSCOPY WITH PROPOFOL;  Surgeon: Lin Landsman, MD;  Location: [facility] ENDOSCOPY;  Service: Gastroenterology;  Laterality: N/A;  . Excision of melanoma    . INTRAUTERINE DEVICE (IUD) INSERTION  2009  . IUD REMOVAL  2014  . WISDOM TOOTH EXTRACTION      Family History  Problem Relation Age of Onset  . Hypertension Father   . Diabetes Father   . Hyperlipidemia Brother   . Hypertension Brother   . Colon cancer Paternal Grandfather 90  . Colon cancer Paternal Uncle 110  . Breast cancer Neg Hx     Social History:  reports that she has never smoked. She has never used smokeless tobacco. She reports current alcohol use. She reports that she does not use drugs.  Allergies:  Allergies  Allergen Reactions  . Promethazine Other (See Comments)    uncontrollable reflex    Medications: I have reviewed the  patient's current medications.  Results for orders placed or performed during the hospital encounter of 06/07/19 (from the past 48 hour(s))  CBC     Status: Abnormal   Collection Time: 06/07/19  4:06 PM  Result Value Ref Range   WBC 10.2 4.0 - 10.5 K/uL   RBC 4.39 3.87 - 5.11 MIL/uL   Hemoglobin 11.6 (L) 12.0 - 15.0 g/dL   HCT 35.6 (L) 36.0 - 46.0 %   MCV 81.1 80.0 - 100.0 fL   MCH 26.4 26.0 - 34.0 pg   MCHC 32.6 30.0 - 36.0 g/dL   RDW 15.6 (H) 11.5 - 15.5 %   Platelets 365 150 - 400 K/uL   nRBC 0.0 0.0 - 0.2 %    Comment: Performed at [facility], [address], [place], [place] [id number]  Basic metabolic panel     Status: Abnormal   Collection Time: 06/07/19  4:06 PM  Result Value Ref Range   Sodium 136 135 - 145 mmol/L   Potassium 3.2 (L) 3.5 - 5.1 mmol/L   Chloride 107 98 - 111 mmol/L   CO2 20 (L) 22 - 32 mmol/L   Glucose, Bld 122 (H) 70 - 99 mg/dL    Comment: Glucose reference range applies only to samples taken after fasting for at least 8 hours.   BUN 13 6 - 20 mg/dL   Creatinine,  Ser 0.76 0.44 - 1.00 mg/dL   Calcium 9.0 8.9 - 10.3 mg/dL   GFR calc non Af Amer >60 >60 mL/min   GFR calc Af Amer >60 >60 mL/min   Anion gap 9 5 - 15    Comment: Performed at [facility], [address] [place], [place] [address]  Type and screen [place]     Status: None (Preliminary result)   Collection Time: 06/07/19  4:06 PM  Result Value Ref Range   ABO/RH(D) A POS    Antibody Screen NEG    Sample Expiration 06/10/2019,2359    Unit Number [id number]    Blood Component Type RED CELLS,LR    Unit division 00    Status of Unit ISSUED    Transfusion Status OK TO TRANSFUSE    Crossmatch Result      COMPATIBLE Performed at [facility], [address], [place], [place] [address]    Unit Number [id number]    Blood Component Type RBC LR PHER2    Unit division 00    Status of Unit ISSUED    Transfusion Status OK TO  TRANSFUSE    Crossmatch Result COMPATIBLE   Prepare RBC (crossmatch)     Status: None   Collection Time: 06/07/19  4:18 PM  Result Value Ref Range   Order Confirmation      ORDER PROCESSED BY BLOOD BANK Performed at [facility], [address] [place], [place] [address]   Protime-INR     Status: None   Collection Time: 06/07/19  4:20 PM  Result Value Ref Range   Prothrombin Time 13.5 11.4 - 15.2 seconds   INR 1.0 0.8 - 1.2    Comment: (NOTE) INR goal varies based on device and disease states. Performed at [facility], [address] [place], [place] [address]   APTT     Status: Abnormal   Collection Time: 06/07/19  4:20 PM  Result Value Ref Range   aPTT <24 (L) 24 - 36 seconds    Comment: Performed at [facility], [address] [place], [place] [address]  hCG, quantitative, pregnancy     Status: None   Collection Time: 06/07/19  4:21 PM  Result Value Ref Range   hCG, Beta Chain, Quant, S <1 <5 mIU/mL    Comment:          GEST. AGE      CONC.  (mIU/mL)   <=1 WEEK        5 - 50     2 WEEKS       50 - 500     3 WEEKS       100 - 10,000     4 WEEKS     1,000 - 30,000     5 WEEKS     3,500 - 115,000   6-8 WEEKS     12,000 - 270,000    12 WEEKS     15,000 - 220,000        FEMALE AND NON-PREGNANT FEMALE:     LESS THAN 5 mIU/mL Performed at [facility], [address] [place], [place] [address]   ABO/Rh     Status: None   Collection Time: 06/07/19  6:00 PM  Result Value Ref Range   ABO/RH(D)      A POS Performed at [facility], [address] [place],  [address]   Hemoglobin and hematocrit, blood     Status:  Abnormal   Collection Time: 06/07/19  7:39 PM  Result Value Ref Range   Hemoglobin 11.3 (L) 12.0 - 15.0 g/dL   HCT 34.7 (L) 36.0 - 46.0 %    Comment: Performed at [facility], [address], [place], [place] [address]    US PELVIC COMPLETE WITH TRANSVAGINAL  Result Date:  06/07/2019 CLINICAL DATA:  Initial evaluation for heavy vaginal bleeding. EXAM: TRANSABDOMINAL AND TRANSVAGINAL ULTRASOUND OF PELVIS TECHNIQUE: Both transabdominal and transvaginal ultrasound examinations of the pelvis were performed. Transabdominal technique was performed for global imaging of the pelvis including uterus, ovaries, adnexal regions, and pelvic cul-de-sac. It was necessary to proceed with endovaginal exam following the transabdominal exam to visualize the uterus, endometrium, and ovaries. COMPARISON:  Prior CT from 11/22/2006. FINDINGS: Uterus Measurements: 18.0 x 8.0 x 10.4 cm = volume: 785.5 mL. Large centrally located intramural fibroid measuring 14.3 x 9.4 x 9.7 cm is seen. Endometrium Thickness: 7 mm.  No focal abnormality visualized. Right ovary Not visualized.  No adnexal mass. Left ovary Not visualized.  No adnexal mass. Other findings No abnormal free fluid. IMPRESSION: 1. Large centrally located intramural fibroid measuring up to 14.3 cm. 2. Endometrial stripe measures 7 mm in thickness. If bleeding remains unresponsive to hormonal or medical therapy, sonohysterogram should be considered for focal lesion work-up. (Ref: Radiological Reasoning: Algorithmic Workup of Abnormal Vaginal Bleeding with Endovaginal Sonography and Sonohysterography. AJR [id number]). Electronically Signed   By: Jeannine Boga M.D.   On: 06/07/2019 18:16    Review of Systems  Constitutional: Negative for chills and fever.  HENT: Negative for congestion, hearing loss and sinus pain.   Respiratory: Negative for cough, shortness of breath and wheezing.   Cardiovascular: Negative for chest pain, palpitations and leg swelling.  Gastrointestinal: Negative for abdominal pain, constipation, diarrhea, nausea and vomiting.  Genitourinary: Negative for dysuria, flank pain, frequency, hematuria and urgency.  Musculoskeletal: Negative for back pain.  Skin: Negative for rash.  Neurological: Negative for  dizziness and headaches.  Psychiatric/Behavioral: Negative for suicidal ideas. The patient is not nervous/anxious.    Blood pressure 112/77, pulse 87, temperature 97.6 F (36.4 C), temperature source Oral, resp. rate 16, height 5\' 4"  (1.626 m), weight 67.1 kg, last menstrual period 05/03/2019, SpO2 100 %. Physical Exam  Nursing note and vitals reviewed. Constitutional: She is oriented to person, place, and time. She appears well-developed and well-nourished.  HENT:  Head: Normocephalic and atraumatic.  Cardiovascular: Normal rate and regular rhythm.  Respiratory: Effort normal and breath sounds normal.  GI: Soft. Bowel sounds are normal.  Genitourinary:    Genitourinary Comments: External: Normal appearing vulva. No lesions noted. Blood on the perineum. Speculum examination: Patulous cervix deviated to the patient's left. 5 cc blood in the vaginal vault. After blood was cleared with swabs no active bleeding observed.No discharge.   Bimanual examination: Uterus midline, non-tender, enlarged to the patient's umbilicus. Pelvic and intramural fibroid.  No CMT. No adnexal masses. No adnexal tenderness. Pelvis not fixed.    Musculoskeletal:        General: Normal range of motion.  Neurological: She is alert and oriented to person, place, and time.  Skin: Skin is warm and dry.  Psychiatric: She has a normal mood and affect. Her behavior is normal. Judgment and thought content normal.   Assessment/Plan: 49 yo with acute vaginal bleeding and syncopal episode today in the ER. Patient is on her menstrual cycle and has a history of a fibroid uterus. Today's US reveals that  her fibroids have grown significantly in size since first seen in 2018.  Discussed options for admission and observation overnight versus discharge home. Patient's bleeding is stable. Hemoglobin is stable.  Her vital signs are stable. She is comfortable with being discharged home. Discussed taking Lysteda during this menstrual cycle  to help decrease further bleeding. Discussed with patient her options for management of her fibroid uterus. Recommended a hysterectomy given the size of her uterine fibroids and since she is done with childbearing. She will follow up in the office week. If she desires surgery it can be discussed more and scheduled at that time. Message sent to scheduler to follow up with patient.   More than 60 minutes were spent face to face with the patient in the room, reviewing the medical record, labs and images, and coordinating care for the patient. The plan of management was discussed in detail and counseling was provided.   Leonia Heatherly R Beya Tipps 06/07/2019, 8:28 PM

## 2019-06-07 NOTE — ED Triage Notes (Signed)
Pt states vaginal bleeding x 3 hours. Changing pad q76min. States clots present. Appears anxious. Denies anemia. States LMP 34 days ago. Denies being pregnant.

## 2019-06-07 NOTE — ED Notes (Signed)
Pt responding to voice at this time.

## 2019-06-07 NOTE — ED Notes (Signed)
Ultrasound at bedside

## 2019-06-07 NOTE — ED Provider Notes (Signed)
[facility] Emergency Department Provider Note  ____________________________________________   First MD Initiated Contact with Patient 06/07/19 1613     (approximate)  I have reviewed the triage vital signs and the nursing notes.   HISTORY  Chief Complaint Vaginal Bleeding    HPI Cheryl Schmidt is a 49 y.o. female with history of fibroids who comes in with severe vaginal bleeding.  Patient stated that vaginal bleeding started 3 hours ago.  States that she is using 1 pad every 10 to 15 minutes.  Patient is responsive in triage but then becomes unresponsive, pale and diaphoretic.  Patient moved back to the room for immediate evaluation.  Is unable to get full HPI due to her altered mental status.  However according to the triage nurse patient stated that there is not a chance that she could be pregnant given her husband had a vasectomy.  She never had bleeding like this before.          Past Medical History:  Diagnosis Date  . Fibroids   . Melanoma ([place]) 2001   upper abdomen in the 90s/ Dr Nehemiah Massed  . Menorrhagia with regular cycle   . Ovarian cyst     Patient Active Problem List   Diagnosis Date Noted  . Occult blood in stools   . Family history of colon cancer   . Fibroids   . Menorrhagia with regular cycle   . Chronic constipation 11/12/2016  . Lower abdominal pain 11/12/2016  . Intramural leiomyoma of uterus 06/10/2016    Past Surgical History:  Procedure Laterality Date  . COLONOSCOPY WITH PROPOFOL N/A 10/10/2018   Procedure: COLONOSCOPY WITH PROPOFOL;  Surgeon: Lin Landsman, MD;  Location: [facility] ENDOSCOPY;  Service: Gastroenterology;  Laterality: N/A;  . Excision of melanoma    . INTRAUTERINE DEVICE (IUD) INSERTION  2009  . IUD REMOVAL  2014  . WISDOM TOOTH EXTRACTION      Prior to Admission medications   Medication Sig Start Date End Date Taking? Authorizing Provider  naproxen (NAPROSYN) 500 MG tablet Take one twice a day at  the start of menses through the heaviest flow days 09/23/18   Dalia Heading, CNM  predniSONE (DELTASONE) 10 MG tablet Take 3 tabs on days 1-2, take 2 tabs on days 3-4, take 1 tab on days 5-6 03/05/19   Jearld Fenton, NP    Allergies Promethazine  Family History  Problem Relation Age of Onset  . Hypertension Father   . Diabetes Father   . Hyperlipidemia Brother   . Hypertension Brother   . Colon cancer Paternal Grandfather 60  . Colon cancer Paternal Uncle 29  . Breast cancer Neg Hx     Social History Social History   Tobacco Use  . Smoking status: Never Smoker  . Smokeless tobacco: Never Used  Substance Use Topics  . Alcohol use: Yes    Comment: 1 glass of wine/month  . Drug use: Never      Review of Systems Unable to get full review of systems due to altered mental status ____________________________________________   PHYSICAL EXAM:  VITAL SIGNS: ED Triage Vitals  Enc Vitals Group     BP 06/07/19 1603 121/85     Pulse Rate 06/07/19 1603 (!) 105     Resp 06/07/19 1603 18     Temp 06/07/19 1603 97.8 F (36.6 C)     Temp Source 06/07/19 1603 Oral     SpO2 06/07/19 1603 98 %  Weight 06/07/19 1604 148 lb (67.1 kg)     Height 06/07/19 1604 5\' 4"  (1.626 m)     Head Circumference --      Peak Flow --      Pain Score 06/07/19 1604 0     Pain Loc --      Pain Edu? --      Excl. in [place]? --     Constitutional: Altered, pale Eyes: Conjunctivae are normal. EOMI. Head: Atraumatic. Nose: No congestion/rhinnorhea. Mouth/Throat: Mucous membranes are moist.   Neck: No stridor. Trachea Midline. FROM Cardiovascular: Tachycardic, regular rhythm. Grossly normal heart sounds.  Good peripheral circulation. Respiratory: Normal respiratory effort.  No retractions. Lungs CTAB. Gastrointestinal: Soft and nontender. No distention. No abdominal bruits.  Musculoskeletal: No lower extremity tenderness nor edema.  No joint effusions. Neurologic:  Normal speech and language.  No gross focal neurologic deficits are appreciated.  Skin: Patient is cool and pale  psychiatric: Unable to fully assess GU: Significant blood in her underwear and pants.  No active arterial bleeding on pelvic exam.  Pooled venous blood cleared out with 3 Fox swabs  ____________________________________________   LABS (all labs ordered are listed, but only abnormal results are displayed)  Labs Reviewed  CBC - Abnormal; Notable for the following components:      Result Value   Hemoglobin 11.6 (*)    HCT 35.6 (*)    RDW 15.6 (*)    All other components within normal limits  BASIC METABOLIC PANEL - Abnormal; Notable for the following components:   Potassium 3.2 (*)    CO2 20 (*)    Glucose, Bld 122 (*)    All other components within normal limits  APTT - Abnormal; Notable for the following components:   aPTT <24 (*)    All other components within normal limits  PROTIME-INR  HCG, QUANTITATIVE, PREGNANCY  HEMOGLOBIN AND HEMATOCRIT, BLOOD  POC URINE PREG, ED  TYPE AND SCREEN  ABO/RH  PREPARE RBC (CROSSMATCH)   ____________________________________________   ED ECG REPORT I, Vanessa [place], the attending physician, personally viewed and interpreted this ECG.  EKG is normal sinus rate of 77, no ST elevation, no T wave inversions ____________________________________________  RADIOLOGY   Official radiology report(s): US PELVIC COMPLETE WITH TRANSVAGINAL  Result Date: 06/07/2019 CLINICAL DATA:  Initial evaluation for heavy vaginal bleeding. EXAM: TRANSABDOMINAL AND TRANSVAGINAL ULTRASOUND OF PELVIS TECHNIQUE: Both transabdominal and transvaginal ultrasound examinations of the pelvis were performed. Transabdominal technique was performed for global imaging of the pelvis including uterus, ovaries, adnexal regions, and pelvic cul-de-sac. It was necessary to proceed with endovaginal exam following the transabdominal exam to visualize the uterus, endometrium, and ovaries. COMPARISON:   Prior CT from 11/22/2006. FINDINGS: Uterus Measurements: 18.0 x 8.0 x 10.4 cm = volume: 785.5 mL. Large centrally located intramural fibroid measuring 14.3 x 9.4 x 9.7 cm is seen. Endometrium Thickness: 7 mm.  No focal abnormality visualized. Right ovary Not visualized.  No adnexal mass. Left ovary Not visualized.  No adnexal mass. Other findings No abnormal free fluid. IMPRESSION: 1. Large centrally located intramural fibroid measuring up to 14.3 cm. 2. Endometrial stripe measures 7 mm in thickness. If bleeding remains unresponsive to hormonal or medical therapy, sonohysterogram should be considered for focal lesion work-up. (Ref: Radiological Reasoning: Algorithmic Workup of Abnormal Vaginal Bleeding with Endovaginal Sonography and Sonohysterography. AJR [id number]). Electronically Signed   By: Jeannine Boga M.D.   On: 06/07/2019 18:16    ____________________________________________   PROCEDURES  Procedure(s)  performed (including Critical Care):  .Critical Care Performed by: Vanessa [place], MD Authorized by: Vanessa [place], MD   Critical care provider statement:    Critical care time (minutes):  45   Critical care was necessary to treat or prevent imminent or life-threatening deterioration of the following conditions:  Circulatory failure and shock   Critical care was time spent personally by me on the following activities:  Discussions with consultants, evaluation of patient's response to treatment, examination of patient, ordering and performing treatments and interventions, ordering and review of laboratory studies, ordering and review of radiographic studies, pulse oximetry, re-evaluation of patient's condition, obtaining history from patient or surrogate and review of old charts     ____________________________________________   INITIAL IMPRESSION / [place] / ED COURSE  ALEXSANDRA ZETTLE was evaluated in Emergency Department on 06/07/2019 for the symptoms described  in the history of present illness. She was evaluated in the context of the global COVID-19 pandemic, which necessitated consideration that the patient might be at risk for infection with the SARS-CoV-2 virus that causes COVID-19. Institutional protocols and algorithms that pertain to the evaluation of patients at risk for COVID-19 are in a state of rapid change based on information released by regulatory bodies including the CDC and federal and state organizations. These policies and algorithms were followed during the patient's care in the ED.     Patient was initially responsive in triage but then became unresponsive pale diaphoretic tachycardic.  Patient has significant vaginal bleeding.  No arterial bleeding noted.  Will get pregnancy test to make sure no evidence of ectopic pregnancy.  Bedside ultrasound showed either a large bladder a large uterus.  No evidence of free fluid.  Attempted IN and O but had no urine output therefore I think that what we are seeing is a very enlarged irregular uterus.  Will get labs to evaluate for anemia, coagulopathy.  After fluids and 1 unit of blood patient is becoming more responsive and is able to give [place] more of a story.  Sounds of the bleeding started today.  Denies having this previously.  We will send patient for ultrasound to further evaluate what could have caused this bleeding.  Hemoglobin went from 13 2 years ago to 11.6  hCG was negative, no evidence of pregnancy, no evidence of coagulopathy.  On reevaluation patient is doing much better.  Patient is vital signs are stable and is now responsive.  7:21 PM D/w Dr. Gilman Schmidt  they will come down to see patient.   8:29 PM repeat hemoglobin was stable.  Patient was evaluated by OB/GYN patient's bleeding has subsided.  Patient was offered admission versus discharge home and both OB/GYN and patient felt comfortable with going home at this time.  Patient will be started on lyseta and will have follow-up outpatient  with OB/GYN.  Patient understands to return to the ER if her bleeding gets a lot worse for repeat hemoglobin check  I discussed the provisional nature of ED diagnosis, the treatment so far, the ongoing plan of care, follow up appointments and return precautions with the patient and any family or support people present. They expressed understanding and agreed with the plan, discharged home.   ____________________________________________   FINAL CLINICAL IMPRESSION(S) / ED DIAGNOSES   Final diagnoses:  Vaginal bleeding  Fibroid      MEDICATIONS GIVEN DURING THIS VISIT:  Medications - No data to display   ED Discharge Orders  Ordered    tranexamic acid (LYSTEDA) 650 MG TABS tablet  3 times daily     06/07/19 2026           Note:  This document was prepared using Dragon voice recognition software and may include unintentional dictation errors.   Vanessa [place], MD 06/07/19 2031

## 2019-06-07 NOTE — Discharge Instructions (Addendum)
You were seen by OB/GYN.  They are starting you on a medication to decrease the bleeding.  They will follow-up in the office with you next week.  Return to the ER for any other concerns if the bleeding restarts and is greater than 1 pad per hour you should return to the ER for repeat hemoglobin check  IMPRESSION: 1. Large centrally located intramural fibroid measuring up to 14.3 cm. 2. Endometrial stripe measures 7 mm in thickness. If bleeding remains unresponsive to hormonal or medical therapy, sonohysterogram should be considered for focal lesion work-up. (Ref: Radiological Reasoning: Algorithmic Workup of Abnormal Vaginal Bleeding with Endovaginal Sonography and Sonohysterography. AJR [id number]).

## 2019-06-08 ENCOUNTER — Telehealth: Payer: Self-pay | Admitting: Obstetrics and Gynecology

## 2019-06-08 LAB — PREPARE RBC (CROSSMATCH)

## 2019-06-08 NOTE — Telephone Encounter (Signed)
-----   Message from Homero Fellers, MD sent at 06/07/2019  8:28 PM EDT ----- Please call this patient and schedule her for an office visit this week for ER follow up. Thank you,  Dr. Gilman Schmidt

## 2019-06-08 NOTE — Telephone Encounter (Signed)
Patient is schedule for 06/11/19 with [facility]

## 2019-06-08 NOTE — Telephone Encounter (Signed)
Called and left voice mail for patient to call back to be schedule °

## 2019-06-09 LAB — BPAM RBC
Blood Product Expiration Date: 202104212359
Blood Product Expiration Date: 202104222359
ISSUE DATE / TIME: 202104111624
ISSUE DATE / TIME: 202104111624
Unit Type and Rh: 9500
Unit Type and Rh: 9500

## 2019-06-09 LAB — TYPE AND SCREEN
ABO/RH(D): A POS
Antibody Screen: NEGATIVE
Unit division: 0
Unit division: 0

## 2019-06-11 ENCOUNTER — Ambulatory Visit (INDEPENDENT_AMBULATORY_CARE_PROVIDER_SITE_OTHER): Payer: BC Managed Care – PPO | Admitting: Obstetrics and Gynecology

## 2019-06-11 ENCOUNTER — Encounter: Payer: Self-pay | Admitting: Obstetrics and Gynecology

## 2019-06-11 ENCOUNTER — Other Ambulatory Visit: Payer: Self-pay

## 2019-06-11 VITALS — BP 118/80 | Ht 64.0 in | Wt 160.0 lb

## 2019-06-11 DIAGNOSIS — D219 Benign neoplasm of connective and other soft tissue, unspecified: Secondary | ICD-10-CM | POA: Diagnosis not present

## 2019-06-11 DIAGNOSIS — Z130 Encounter for screening for diseases of the blood and blood-forming organs and certain disorders involving the immune mechanism: Secondary | ICD-10-CM

## 2019-06-11 DIAGNOSIS — N939 Abnormal uterine and vaginal bleeding, unspecified: Secondary | ICD-10-CM

## 2019-06-11 DIAGNOSIS — N920 Excessive and frequent menstruation with regular cycle: Secondary | ICD-10-CM

## 2019-06-11 NOTE — Patient Instructions (Signed)
Iron-Rich Diet  Iron is a mineral that helps your body to produce hemoglobin. Hemoglobin is a protein in red blood cells that carries oxygen to your body's tissues. Eating too little iron may cause you to feel weak and tired, and it can increase your risk of infection. Iron is naturally found in many foods, and many foods have iron added to them (iron-fortified foods). You may need to follow an iron-rich diet if you do not have enough iron in your body due to certain medical conditions. The amount of iron that you need each day depends on your age, your sex, and any medical conditions you have. Follow instructions from your health care provider or a diet and nutrition specialist (dietitian) about how much iron you should eat each day. What are tips for following this plan? Reading food labels  Check food labels to see how many milligrams (mg) of iron are in each serving. Cooking  Cook foods in pots and pans that are made from iron.  Take these steps to make it easier for your body to absorb iron from certain foods: ? Soak beans overnight before cooking. ? Soak whole grains overnight and drain them before using. ? Ferment flours before baking, such as by using yeast in bread dough. Meal planning  When you eat foods that contain iron, you should eat them with foods that are high in vitamin C. These include oranges, peppers, tomatoes, potatoes, and mango. Vitamin C helps your body to absorb iron. General information  Take iron supplements only as told by your health care provider. An overdose of iron can be life-threatening. If you were prescribed iron supplements, take them with orange juice or a vitamin C supplement.  When you eat iron-fortified foods or take an iron supplement, you should also eat foods that naturally contain iron, such as meat, poultry, and fish. Eating naturally iron-rich foods helps your body to absorb the iron that is added to other foods or contained in a  supplement.  Certain foods and drinks prevent your body from absorbing iron properly. Avoid eating these foods in the same meal as iron-rich foods or with iron supplements. These foods include: ? Coffee, black tea, and red wine. ? Milk, dairy products, and foods that are high in calcium. ? Beans and soybeans. ? Whole grains. What foods should I eat? Fruits Prunes. Raisins. Eat fruits high in vitamin C, such as oranges, grapefruits, and strawberries, alongside iron-rich foods. Vegetables Spinach (cooked). Green peas. Broccoli. Fermented vegetables. Eat vegetables high in vitamin C, such as leafy greens, potatoes, bell peppers, and tomatoes, alongside iron-rich foods. Grains Iron-fortified breakfast cereal. Iron-fortified whole-wheat bread. Enriched rice. Sprouted grains. Meats and other proteins Beef liver. Oysters. Beef. Shrimp. Turkey. Chicken. Tuna. Sardines. Chickpeas. Nuts. Tofu. Pumpkin seeds. Beverages Tomato juice. Fresh orange juice. Prune juice. Hibiscus tea. Fortified instant breakfast shakes. Sweets and desserts Blackstrap molasses. Seasonings and condiments Tahini. Fermented soy sauce. Other foods Wheat germ. The items listed above may not be a complete list of recommended foods and beverages. Contact a dietitian for more information. What foods should I avoid? Grains Whole grains. Bran cereal. Bran flour. Oats. Meats and other proteins Soybeans. Products made from soy protein. Black beans. Lentils. Mung beans. Split peas. Dairy Milk. Cream. Cheese. Yogurt. Cottage cheese. Beverages Coffee. Black tea. Red wine. Sweets and desserts Cocoa. Chocolate. Ice cream. Other foods Basil. Oregano. Large amounts of parsley. The items listed above may not be a complete list of foods and beverages to avoid.   Contact a dietitian for more information. Summary  Iron is a mineral that helps your body to produce hemoglobin. Hemoglobin is a protein in red blood cells that carries  oxygen to your body's tissues.  Iron is naturally found in many foods, and many foods have iron added to them (iron-fortified foods).  When you eat foods that contain iron, you should eat them with foods that are high in vitamin C. Vitamin C helps your body to absorb iron.  Certain foods and drinks prevent your body from absorbing iron properly, such as whole grains and dairy products. You should avoid eating these foods in the same meal as iron-rich foods or with iron supplements. This information is not intended to replace advice given to you by your health care provider. Make sure you discuss any questions you have with your health care provider. Document Revised: 01/25/2017 Document Reviewed: 01/08/2017 Elsevier Patient Education  2020 [place]. Total Laparoscopic Hysterectomy A total laparoscopic hysterectomy is a minimally invasive surgery to remove the uterus and cervix. The fallopian tubes and ovaries can also be removed (bilateral salpingo-oophorectomy) during this surgery, if necessary. This procedure may be done to treat problems such as:  Noncancerous growths in the uterus (uterine fibroids) that cause symptoms.  A condition that causes the lining of the uterus (endometrium) to grow in other areas (endometriosis).  Problems with pelvic support. This is caused by weakened muscles of the pelvis following vaginal childbirth or menopause.  Cancer of the cervix, ovaries, uterus, or endometrium.  Excessive (dysfunctional) uterine bleeding. This surgery is performed by inserting a thin, lighted tube (laparoscope) and surgical instruments into small incisions in the abdomen. The laparoscope sends images to a monitor. The images help the health care provider perform the procedure. After this procedure, you will no longer be able to have a baby, and you will no longer have a menstrual period. Tell a health care provider about:  Any allergies you have.  All medicines you are taking,  including vitamins, herbs, eye drops, creams, and over-the-counter medicines.  Any problems you or family members have had with anesthetic medicines.  Any blood disorders you have.  Any surgeries you have had.  Any medical conditions you have.  Whether you are pregnant or may be pregnant. What are the risks? Generally, this is a safe procedure. However, problems may occur, including:  Infection.  Bleeding.  Blood clots in the legs or lungs.  Allergic reactions to medicines.  Damage to other structures or organs.  The risk that the surgery may have to be switched to the regular one in which a large incision is made in the abdomen (abdominal hysterectomy). What happens before the procedure? Staying hydrated Follow instructions from your health care provider about hydration, which may include:  Up to 2 hours before the procedure - you may continue to drink clear liquids, such as water, clear fruit juice, black coffee, and plain tea Eating and drinking restrictions Follow instructions from your health care provider about eating and drinking, which may include:  8 hours before the procedure - stop eating heavy meals or foods such as meat, fried foods, or fatty foods.  6 hours before the procedure - stop eating light meals or foods, such as toast or cereal.  6 hours before the procedure - stop drinking milk or drinks that contain milk.  2 hours before the procedure - stop drinking clear liquids. Medicines  Ask your health care provider about: ? Changing or stopping your regular medicines. This  is especially important if you are taking diabetes medicines or blood thinners. ? Taking over-the-counter medicines, vitamins, herbs, and supplements. ? Taking medicines such as aspirin and ibuprofen. These medicines can thin your blood. Do not take these medicines unless your health care provider tells you to take them.  You may be given antibiotic medicine to help prevent  infection.  You may be asked to take laxatives.  You may be given medicines to help prevent nausea and vomiting after the procedure. General instructions  Ask your health care provider how your surgical site will be marked or identified.  You may be asked to shower with a germ-killing soap.  Do not use any products that contain nicotine or tobacco, such as cigarettes and e-cigarettes. If you need help quitting, ask your health care provider.  You may have an exam or testing, such as an ultrasound to determine the size and shape of your pelvic organs.  You may have a blood or urine sample taken.  This procedure can affect the way you feel about yourself. Talk with your health care provider about the physical and emotional changes hysterectomy may cause.  Plan to have someone take you home from the hospital or clinic.  Plan to have a responsible adult care for you for at least 24 hours after you leave the hospital or clinic. This is important. What happens during the procedure?  To lower your risk of infection: ? Your health care team will wash or sanitize their hands. ? Your skin will be washed with soap. ? Hair may be removed from the surgical area.  An IV will be inserted into one of your veins.  You will be given one or more of the following: ? A medicine to help you relax (sedative). ? A medicine to make you fall asleep (general anesthetic).  You will be given antibiotic medicine through your IV.  A tube may be inserted down your throat to help you breathe during the procedure.  A gas (carbon dioxide) will be used to inflate your abdomen to allow your surgeon to see inside of your abdomen.  Three or four small incisions will be made in your abdomen.  A laparoscope will be inserted into one of your incisions. Surgical instruments will be inserted through the other incisions in order to perform the procedure.  Your uterus and cervix may be removed through your vagina or  cut into small pieces and removed through the small incisions. Any other organs that need to be removed will also be removed this way.  Carbon dioxide will be released from inside of your abdomen.  Your incisions will be closed with stitches (sutures).  A bandage (dressing) may be placed over your incisions. The procedure may vary among health care providers and hospitals. What happens after the procedure?  Your blood pressure, heart rate, breathing rate, and blood oxygen level will be monitored until the medicines you were given have worn off.  You will be given medicine for pain and nausea as needed.  Do not drive for 24 hours if you received a sedative. Summary  Total Laparoscopic hysterectomy is a procedure to remove your uterus, cervix and sometimes the fallopian tubes and ovaries.  This procedure can affect the way you feel about yourself. Talk with your health care provider about the physical and emotional changes hysterectomy may cause.  After this procedure, you will no longer be able to have a baby, and you will no longer have a menstrual period.  You will be given pain medicine to control discomfort after this procedure. This information is not intended to replace advice given to you by your health care provider. Make sure you discuss any questions you have with your health care provider. Document Revised: 01/25/2017 Document Reviewed: 04/25/2016 Elsevier Patient Education  2020 [employer].

## 2019-06-11 NOTE — Progress Notes (Signed)
Patient ID: Cheryl Schmidt, female   DOB: 15-Aug-1970, 49 y.o.   MRN: [id number]  Reason for Consult: Hospitalization Follow-up   Referred by Pleas Koch, NP  Subjective:     HPI:  Cheryl Schmidt is a 49 y.o. female .  She is following up today after her recent ER visit on 06/07/2019 for acute vaginal bleeding.  She has a known fibroid uterus since 2018 and has a history of menorrhagia with a regular menstrual cycle.  However on 4/11 her bleeding was heavier than has ever been.  She reports that blood was pouring out of her like a faucet.  She reports that she had bleed beach towels on the floor of her bathroom and was saturating them with blood.  She was passing large clots the size of her hand.  In the ER waiting area she had become unresponsive and had to be emergently resuscitated with uncrossed blood products.  Her bleeding then stabilized, she regained consciousness and was able to be discharged home after several hours of observation in the ER.  She has been taking Lysteda 3 times a day since her discharge to help control her uterine bleeding.  She reports that on Monday evening she had a period of heavy bleeding for 2 hours but it resolved on her own and she did not have to come to the hospital.  She has been feeling fatigued and tired however since the second event on Monday.  She reports that today her bleeding is minimal.  I saw her in the ER on Sunday after the bleeding event had resolved at that time.  We spoke at that time regarding options for treatment of uterine fibroids.  This included options such as uterine artery embolization myomectomy and hysterectomy.  Cheryl Schmidt has considered her options and is interested in this time and having hysterectomy.    Gynecological History  Menopause: not applicable LMP: [id number] Describes periods as Regular, heavy, monthly Last pap smear: 09/23/2018- NIL Last Mammogram: 08/22/2018 BIRADS 1 Sexually Active: yes  Obstetrical  History [id number] History of 2 vaginal deliveries  Past Medical History:  Diagnosis Date  . Fibroids   . Melanoma ([place]) 2001   upper abdomen in the 90s/ Dr Nehemiah Massed  . Menorrhagia with regular cycle   . Ovarian cyst    Family History  Problem Relation Age of Onset  . Hypertension Father   . Diabetes Father   . Hyperlipidemia Brother   . Hypertension Brother   . Colon cancer Paternal Grandfather 75  . Colon cancer Paternal Uncle 68  . Breast cancer Neg Hx    Past Surgical History:  Procedure Laterality Date  . COLONOSCOPY WITH PROPOFOL N/A 10/10/2018   Procedure: COLONOSCOPY WITH PROPOFOL;  Surgeon: Lin Landsman, MD;  Location[facility] ENDOSCOPY;  Service: Gastroenterology;  Laterality: N/A;  . Excision of melanoma    . INTRAUTERINE DEVICE (IUD) INSERTION  2009  . IUD REMOVAL  2014  . WISDOM TOOTH EXTRACTION      Short Social History:  Social History   Tobacco Use  . Smoking status: Never Smoker  . Smokeless tobacco: Never Used  Substance Use Topics  . Alcohol use: Yes    Comment: 1 glass of wine/month    Allergies  Allergen Reactions  . Promethazine Other (See Comments)    uncontrollable reflex    Current Outpatient Medications  Medication Sig Dispense Refill  . naproxen (NAPROSYN) 500 MG tablet Take one twice a day at the start of  menses through the heaviest flow days 50 tablet 1  . Pediatric Multivit-Minerals-C (CHILDRENS GUMMIES PO) Take 2 each by mouth daily.    . tranexamic acid (LYSTEDA) 650 MG TABS tablet Take 2 tablets (1,300 mg total) by mouth 3 (three) times daily for 5 days. 30 tablet 1   No current facility-administered medications for this visit.    Review of Systems  Constitutional: Negative for chills, fatigue, fever and unexpected weight change.  HENT: Negative for trouble swallowing.  Eyes: Negative for loss of vision.  Respiratory: Negative for cough, shortness of breath and wheezing.  Cardiovascular: Negative for chest pain, leg  swelling, palpitations and syncope.  GI: Negative for abdominal pain, blood in stool, diarrhea, nausea and vomiting.  GU: Negative for difficulty urinating, dysuria, frequency and hematuria.  Musculoskeletal: Negative for back pain, leg pain and joint pain.  Skin: Negative for rash.  Neurological: Negative for dizziness, headaches, light-headedness, numbness and seizures.  Psychiatric: Negative for behavioral problem, confusion, depressed mood and sleep disturbance.        Objective:  Objective   Vitals:   06/11/19 0812  BP: 118/80  Weight: 160 lb (72.6 kg)  Height: 5\' 4"  (1.626 m)   Body mass index is 27.46 kg/m.  Physical Exam Vitals and nursing note reviewed.  Constitutional:      Appearance: She is well-developed.  HENT:     Head: Normocephalic and atraumatic.  Eyes:     Pupils: Pupils are equal, round, and reactive to light.  Cardiovascular:     Rate and Rhythm: Normal rate and regular rhythm.  Pulmonary:     Effort: Pulmonary effort is normal. No respiratory distress.  Genitourinary:    Comments: External: Vulva normal. no lesions noted.  Bimanual examination: Uterus enlarged to umbilicus. Encompasses the pelvis. Some extension of the fibroid to the right broad ligament. Non-tender. Bright red bleeding after manipulation of the uterus on exam which resolved. Skin:    General: Skin is warm and dry.  Neurological:     Mental Status: She is alert and oriented to person, place, and time.  Psychiatric:        Behavior: Behavior normal.        Thought Content: Thought content normal.        Judgment: Judgment normal.        Assessment/Plan:     49 year old with enlarged fibroid uterus extending to her umbilicus, longstanding menorrhagia with regular cycle, abnormal uterine bleeding secondary to fibroid uterus with recent acute hemorrhage. Patient is motivated to have a hysterectomy and would like to try and have a hysterectomy in the next month.  She is concerned  that if she were to have another.  She would again have an acute bleeding episode which could result in her hospitalization.  We have discussed options for therapy with Lupron to help shrink the uterine fibroids prior to surgery.  She understands that using the Lupron prior to surgery could improve her surgical outcomes and reduce risk of injury as well as the risk of a laparotomy. However it is her preference to have the surgery as soon as possible and for that reason she declines presurgical therapy with Lupron. Her ultrasound in 2018 showed 2 fibroids one in the lower uterine segment and one intramural fibroid.  On pelvic exam her uterus encompasses the pelvis and her cervix is deviated to her left.  Ultrasound views were limited by the size of the fibroids.  For this reason we will order a stat MRI  to help with presurgical planning.  Goal will also be to evaluate ureters for any hydrosalpinx.  Prior authorization was obtained by peer to peer review in the office.  Because of the size of her uterus we will plan to schedule this surgery with gynecological oncology for their assistance in completing a robotic hysterectomy for the patient.  She understands that to remove the uterus vaginally the uterus would need to be morcellated.  Morcellation would be completed by hand and after the uterus was placed into a bag.  There is a low risk of leiomyosarcoma however in the setting of morcellation with a leiomyosarcoma she understands that this could result in abdominal seeding of cancer cells in the abdominal cavity.  Morcellation vaginally might not be able to be completed and there is the possibility of a small an abdominal incision or a laparotomy. She understands that at the time of surgery the goal will be to conserve her ovaries.  However given that surgical situations can arise that would make retention of her ovaries impossible, her ovaries may need to be removed.  She understands that if her ovaries are removed  she will need to be on hormone replacement therapy.  Will refer patient to urology for their surgical assistance and placement of ureteral stents.  I have had a careful discussion with this patient about all the options available and the risk/benefits of each. I have fully informed this patient that a hysterectomy may subject her to a variety of discomforts and risks: She understands that most patients have surgery with little difficulty, but problems can happen ranging from minor to fatal. These include nausea, vomiting, pain, bleeding, infection, poor healing, hernia, wound separation, vaginal cuff separation or formation of adhesions. Unexpected reactions may occur from any drug or anesthetic given. Unintended injury may occur to other pelvic or abdominal structures such as Fallopian tubes, ovaries, bladder, ureter (tube from kidney to bladder), or bowel. Nerves going from the pelvis to the legs may be injured. Any such injury may require immediate or later additional surgery to correct the problem. Excessive blood loss requiring transfusion is very unlikely but possible. Dangerous blood clots may form in the legs or lungs. Physical and sexual activity will be restricted in varying degrees for an indeterminate period of time but most often 2-4 weeks. She understands that the plan is to do this laparoscopically, however, there is a chance that this will need to be performed via a larger incision. She may be hospitalized overnight. Finally, she understands that it is impossible to list every possible undesirable effect and that the condition for which surgery is done is not always cured or significantly improved, and in rare cases may be even worse. I have also counseled her extensively about the pros and cons of ovarian conservation versus removal. Ample time was given to answer all questions.  More than 60 minutes were spent face to face with the patient in the room, reviewing the medical record, labs and  images, and coordinating care for the patient. The plan of management was discussed in detail and counseling was provided.    Adrian Prows MD [facility] 06/11/2019 8:49 AM

## 2019-06-12 ENCOUNTER — Other Ambulatory Visit: Payer: Self-pay

## 2019-06-12 ENCOUNTER — Ambulatory Visit
Admission: RE | Admit: 2019-06-12 | Discharge: 2019-06-12 | Disposition: A | Discharge status: Home / Self Care | Payer: BC Managed Care – PPO | Source: Ambulatory Visit | Attending: Obstetrics and Gynecology | Admitting: Obstetrics and Gynecology

## 2019-06-12 DIAGNOSIS — D219 Benign neoplasm of connective and other soft tissue, unspecified: Secondary | ICD-10-CM | POA: Diagnosis not present

## 2019-06-12 DIAGNOSIS — N939 Abnormal uterine and vaginal bleeding, unspecified: Secondary | ICD-10-CM | POA: Insufficient documentation

## 2019-06-12 DIAGNOSIS — D259 Leiomyoma of uterus, unspecified: Secondary | ICD-10-CM | POA: Diagnosis not present

## 2019-06-12 LAB — CBC
Hematocrit: 29.3 % — ABNORMAL LOW (ref 34.0–46.6)
Hemoglobin: 9.3 g/dL — ABNORMAL LOW (ref 11.1–15.9)
MCH: 26.7 pg (ref 26.6–33.0)
MCHC: 31.7 g/dL (ref 31.5–35.7)
MCV: 84 fL (ref 79–97)
Platelets: 358 10*3/uL (ref 150–450)
RBC: 3.48 x10E6/uL — ABNORMAL LOW (ref 3.77–5.28)
RDW: 15.2 % (ref 11.7–15.4)
WBC: 8.6 10*3/uL (ref 3.4–10.8)

## 2019-06-12 MED ORDER — GADOBUTROL 1 MMOL/ML IV SOLN
7.0000 mL | Freq: Once | INTRAVENOUS | Status: AC | PRN
  Administered 2019-06-12: 7 mL via INTRAVENOUS

## 2019-06-12 NOTE — Progress Notes (Signed)
Called and discussed with patient. She will start an iron supplement. She is already increasing her consumption or red meat.

## 2019-06-17 ENCOUNTER — Other Ambulatory Visit: Payer: Self-pay

## 2019-06-17 ENCOUNTER — Inpatient Hospital Stay: Payer: BC Managed Care – PPO | Attending: Obstetrics and Gynecology | Admitting: Obstetrics and Gynecology

## 2019-06-17 VITALS — BP 120/81 | HR 73 | Temp 96.7°F | Resp 18 | Wt 160.3 lb

## 2019-06-17 DIAGNOSIS — N920 Excessive and frequent menstruation with regular cycle: Secondary | ICD-10-CM | POA: Insufficient documentation

## 2019-06-17 DIAGNOSIS — K5909 Other constipation: Secondary | ICD-10-CM | POA: Insufficient documentation

## 2019-06-17 DIAGNOSIS — D219 Benign neoplasm of connective and other soft tissue, unspecified: Secondary | ICD-10-CM

## 2019-06-17 DIAGNOSIS — Z85820 Personal history of malignant melanoma of skin: Secondary | ICD-10-CM | POA: Diagnosis not present

## 2019-06-17 DIAGNOSIS — R42 Dizziness and giddiness: Secondary | ICD-10-CM | POA: Diagnosis not present

## 2019-06-17 NOTE — Progress Notes (Addendum)
Gynecologic Oncology Consult Visit   Referring Provider: Dr Gilman Schmidt  Chief Concern: fibroids, menorrhagia  Subjective:  Cheryl Schmidt is a 49 y.o. P2 female who is seen in consultation from Dr. Gilman Schmidt for fibroids and menorrhagia.  Seen in ED 4/11 with heavy vaginal bleeding. She became unresponsive in the waiting area and was resuscitated with IV fluids and uncrossed blood. Work up revealed that she has an enlarged fibroid uterus. Her vaginal bleeding diminished on it's own while she was in the ER.  Hbg down from 13 baseline to 11. Sent home with tranexamic acid for bleeding and just finished taking this.       [place] 4/11 IMPRESSION: 1. Large centrally located intramural fibroid measuring up to 14.3 cm. 2. Endometrial stripe measures 7 mm in thickness. If bleeding remains unresponsive to hormonal or medical therapy, sonohysterogram should be considered for focal lesion work-up.   MRI 4/16 IMPRESSION: 1. There is a very large contrast enhancing fibroid arising from the anterior right aspect of the lower uterine segment, which appears to essentially efface the lower portion of the endometrial cavity with a very large submucosal component. This fibroid measures approximately 12.6 x 10.8 x 9.7 cm. 2. Suspect an additional smaller intramural fibroid of the anterior uterine fundus, difficult to discretely appreciate against adjacent myometrium but measuring approximately 2.5 cm.  Seen by Dr Gilman Schmidt and desires robotic hysterectomy.  Had one other episode of heavy bleeding and Hgb down to 9.  Started iron pills.  Normal periods in general. Fertility not an issue. Does have some dizziness.   Problem List: Patient Active Problem List   Diagnosis Date Noted  . Occult blood in stools   . Family history of colon cancer   . Fibroids   . Menorrhagia with regular cycle   . Chronic constipation 11/12/2016  . Lower abdominal pain 11/12/2016  . Intramural leiomyoma of uterus 06/10/2016    Past  Medical History: Past Medical History:  Diagnosis Date  . Fibroids   . Melanoma ([place]) 2001   upper abdomen in the 90s/ Dr Nehemiah Massed  . Menorrhagia with regular cycle   . Ovarian cyst     Past Surgical History: Past Surgical History:  Procedure Laterality Date  . COLONOSCOPY WITH PROPOFOL N/A 10/10/2018   Procedure: COLONOSCOPY WITH PROPOFOL;  Surgeon: Lin Landsman, MD;  Location: [facility] ENDOSCOPY;  Service: Gastroenterology;  Laterality: N/A;  . Excision of melanoma    . INTRAUTERINE DEVICE (IUD) INSERTION  2009  . IUD REMOVAL  2014  . WISDOM TOOTH EXTRACTION      OB History:  OB History  Gravida Para Term Preterm AB Living  2 2 2     2   SAB TAB Ectopic Multiple Live Births          2    # Outcome Date GA Lbr Len/2nd Weight Sex Delivery Anes PTL Lv  2 Term 11/01/07   7 lb 5 oz (3.317 kg) M Vag-Spont   LIV  1 Term 03/18/04   7 lb 9 oz (3.43 kg) F Vag-Spont   LIV    Family History: Family History  Problem Relation Age of Onset  . Hypertension Father   . Diabetes Father   . Hyperlipidemia Brother   . Hypertension Brother   . Colon cancer Paternal Grandfather 41  . Colon cancer Paternal Uncle 53  . Breast cancer Neg Hx     Social History: Social History   Socioeconomic History  . Marital status: Married  Spouse name: Cheryl Schmidt  . Number of children: 2  . Years of education: Not on file  . Highest education level: Not on file  Occupational History  . Occupation: Medical illustrator  Tobacco Use  . Smoking status: Never Smoker  . Smokeless tobacco: Never Used  Substance and Sexual Activity  . Alcohol use: Yes    Comment: 1 glass of wine/month  . Drug use: Never  . Sexual activity: Yes    Partners: Male    Birth control/protection: Surgical    Comment: vasectomy  Other Topics Concern  . Not on file  Social History Narrative   Married.   2 children.   Works in Insurance underwriter.   Enjoys spending time with family, walking, exercising.   Social Determinants of  Health   Financial Resource Strain:   . Difficulty of Paying Living Expenses:   Food Insecurity:   . Worried About Charity fundraiser in the Last Year:   . Arboriculturist in the Last Year:   Transportation Needs:   . Film/video editor (Medical):   Marland Kitchen Lack of Transportation (Non-Medical):   Physical Activity:   . Days of Exercise per Week:   . Minutes of Exercise per Session:   Stress:   . Feeling of Stress :   Social Connections:   . Frequency of Communication with Friends and Family:   . Frequency of Social Gatherings with Friends and Family:   . Attends Religious Services:   . Active Member of Clubs or Organizations:   . Attends Archivist Meetings:   Marland Kitchen Marital Status:   Intimate Partner Violence:   . Fear of Current or Ex-Partner:   . Emotionally Abused:   Marland Kitchen Physically Abused:   . Sexually Abused:     Allergies: Allergies  Allergen Reactions  . Promethazine Other (See Comments)    uncontrollable reflex    Current Medications: Current Outpatient Medications  Medication Sig Dispense Refill  . naproxen (NAPROSYN) 500 MG tablet Take one twice a day at the start of menses through the heaviest flow days 50 tablet 1  . Pediatric Multivit-Minerals-C (CHILDRENS GUMMIES PO) Take 2 each by mouth daily.     No current facility-administered medications for this visit.    Review of Systems General: negative for, fevers, chills, fatigue, changes in sleep, changes in weight or appetite Skin: negative for changes in color, texture, moles or lesions Eyes: negative for, changes in vision, pain, diplopia HEENT: negative for, change in hearing, pain, discharge, tinnitus, vertigo, voice changes, sore throat, neck masses Breasts: negative for breast lumps Pulmonary: negative for, dyspnea, orthopnea, productive cough Cardiac: negative for, palpitations, syncope, pain, discomfort, pressure Gastrointestinal: negative for, dysphagia, nausea, vomiting, jaundice, pain,  constipation, diarrhea, hematemesis, hematochezia Musculoskeletal: negative for, pain, stiffness, swelling, range of motion limitation Neurologic/Psych: negative for, headaches, seizures, paralysis, weakness, tremor, change in gait, change in sensation, mood swings, depression, anxiety, change in memory  Objective:  Physical Examination:   Vitals:   06/17/19 1103  BP: 120/81  Pulse: 73  Temp: (!) 96.7 F (35.9 C)  Resp: 18  Weight: 160 lb 4.8 oz (72.7 kg)  SpO2: 100%  TempSrc: Tympanic  BMI (Calculated): 27.5    LMP 06/06/2019    ECOG Performance Status: 1 - Symptomatic but completely ambulatory  General appearance: alert, cooperative and appears stated age HEENT:PERRLA, neck supple with midline trachea and thyroid without masses Lymph node survey: non-palpable, axillary, inguinal, supraclavicular Cardiovascular: regular rate and rhythm, no murmurs or  gallops Respiratory: normal air entry, lungs clear to auscultation Abdomen: soft, uterus palpable below umbilicus Back: inspection of back is normal  Extremities: extremities normal, atraumatic, no cyanosis or edema Skin exam - normal coloration and turgor, no rashes, no suspicious skin lesions noted. Neurological exam reveals alert, oriented, normal speech, no focal findings or movement disorder noted.  Pelvic: exam chaperoned by nurse;  Vulva: normal appearing vulva with no masses, tenderness or lesions; Vagina: normal vagina; Adnexa: normal adnexa in size, nontender and no masses; Uterus: 16-18 weeks size, mobile; Cervix: no lesions; deviated posteriorly by mass Rectal: not indicated  CBC    Component Value Date/Time   WBC 8.6 06/11/2019 0918   WBC 10.2 06/07/2019 1606   RBC 3.48 (L) 06/11/2019 0918   RBC 4.39 06/07/2019 1606   HGB 9.3 (L) 06/11/2019 0918   HCT 29.3 (L) 06/11/2019 0918   PLT 358 06/11/2019 0918   MCV 84 06/11/2019 0918   MCH 26.7 06/11/2019 0918   MCH 26.4 06/07/2019 1606   MCHC 31.7 06/11/2019 0918    MCHC 32.6 06/07/2019 1606   RDW 15.2 06/11/2019 0918   LYMPHSABS 1.9 05/31/2017 0949   EOSABS 0.1 05/31/2017 0949   BASOSABS 0.0 05/31/2017 0949     Chemistry      Component Value Date/Time   NA 136 06/07/2019 1606   NA 140 05/23/2016 0909   K 3.2 (L) 06/07/2019 1606   CL 107 06/07/2019 1606   CO2 20 (L) 06/07/2019 1606   BUN 13 06/07/2019 1606   BUN 12 05/23/2016 0909   CREATININE 0.76 06/07/2019 1606      Component Value Date/Time   CALCIUM 9.0 06/07/2019 1606   ALKPHOS 95 05/23/2016 0909   AST 19 05/23/2016 0909   ALT 20 05/23/2016 0909   BILITOT 0.7 05/23/2016 0909        Assessment:  LORELLA REINCKE is a 49 y.o. female diagnosed with large lower segment fibroid that is submucosal and causing menorrhagia/anemia and requirement for blood transfusion. Uterus 16-18 weeks size on exam.   Medical co-morbidities complicating care: none.  Plan:   Problem List Items Addressed This Visit      Other   Fibroids - Primary   Menorrhagia with regular cycle     We discussed options for management.  Dr Gilman Schmidt has proposed Robotic hysterectomy and I agree with this plan.  She would like Gyn Onc assistance in view of large lower uterine segment myoma.  Surgery will be scheduled for next week 4/28 with Dr Theora Gianotti to assist.  Discussed that this surgery can probably be done robotically, but there is a small chance of needing to convert to open surgery.  Uterus would be placed in a bag and morcellated vaginally if surgery accomplished robotically. Discussed that frozen section will be done and possibility of surgical staging/lymph node sampling in event that cancer found, but this is unlikely.   It would likely not be easy to do endometrial biopsy pre op in clinic due to lower segment myoma and deviation of cervix.  Risk of endometrial cancer low in view of lack of suspicion on MRI and lack of continuous bleeding. Discussed removal of the tubes as well to reduce cancer risk and that ovaries  would be preserved if at all possible since she is premenopausal.   She will see Dr Gilman Schmidt for pre-op visit in preparation for surgery next Wednesday with Dr Theora Gianotti assisting.  She is anemic, but taking iron.  Has some dizziness, but pulse  73 today.   The patient's diagnosis, an outline of the further diagnostic and laboratory studies which will be required, the recommendation, and alternatives were discussed.  All questions were answered to the patient's satisfaction.  A total of 40 minutes were spent with the patient/family today; 60 % was spent in education, counseling and coordination of care for uterine fibroids and menorrhagia .    Mellody Drown, MD  CC:  Pleas Koch, NP [address] [place],  [place] [address] [phone]

## 2019-06-17 NOTE — Progress Notes (Signed)
Pt states that she was on her cycle and was having large clots and went to ER last Sunday- she states that she got IVF and transfusion. Got a clot buster drug for less than week and done. Also had more clots on Monday but did not go to ER. Took tranexamic acid 2 tablets tid for 5 days. No pain right now

## 2019-06-18 ENCOUNTER — Telehealth: Payer: Self-pay | Admitting: Obstetrics and Gynecology

## 2019-06-18 NOTE — Telephone Encounter (Signed)
After talking with Dr Gilman Schmidt I called pt to adv that we will be scheduling surgery on 5/19. I adv that Gilman Schmidt will be contacting her tomorrow with a plan of action for now until surgery.

## 2019-06-18 NOTE — Telephone Encounter (Signed)
Pt called and l/m asking about her surgery with Dr Gilman Schmidt. She adv that she had seen GynOnc yesterday and they told her a surgery date of 4/28. I adv that I was in process of finalizing the coordination with the other surgeon's scheduling coordinators and that Dr Gilman Schmidt was out of the office today but back tomorrow. I adv that I will discuss scheduling with Dr Gilman Schmidt first thing Friday, 4/23. I will call her back to adv of the decided DOS.

## 2019-06-19 ENCOUNTER — Other Ambulatory Visit: Payer: Self-pay | Admitting: Radiology

## 2019-06-23 ENCOUNTER — Other Ambulatory Visit: Payer: Self-pay | Admitting: Obstetrics and Gynecology

## 2019-06-23 DIAGNOSIS — N939 Abnormal uterine and vaginal bleeding, unspecified: Secondary | ICD-10-CM

## 2019-06-23 DIAGNOSIS — N920 Excessive and frequent menstruation with regular cycle: Secondary | ICD-10-CM

## 2019-06-23 MED ORDER — MEDROXYPROGESTERONE ACETATE 10 MG PO TABS: 10.0000 mg | ORAL_TABLET | Freq: Three times a day (TID) | ORAL | 0 refills | Status: DC

## 2019-06-23 NOTE — Telephone Encounter (Signed)
Pt was forwarded to me via CRS to schedule H&P. She is scheduled for 5/7 at 8:30. Her name is in a 8:50 spot but Schuman adv that she will most likely not need the full 40 min with her 1st pt.  Adv pt of Covid testing 5/18 @ 8-10:30 and to quar afterwards until DOS. Also she will rec on MyChart and in paperwork at H&P, Preadmit phone appt info.  Adv that she may rec calls from hospital pharmacy and pre-service ctr.  Confirmed BCBS ins

## 2019-07-03 ENCOUNTER — Ambulatory Visit (INDEPENDENT_AMBULATORY_CARE_PROVIDER_SITE_OTHER): Payer: BC Managed Care – PPO | Admitting: Obstetrics and Gynecology

## 2019-07-03 ENCOUNTER — Encounter: Payer: Self-pay | Admitting: Obstetrics and Gynecology

## 2019-07-03 ENCOUNTER — Other Ambulatory Visit: Payer: Self-pay

## 2019-07-03 VITALS — BP 110/78 | Ht 64.0 in | Wt 160.0 lb

## 2019-07-03 DIAGNOSIS — N939 Abnormal uterine and vaginal bleeding, unspecified: Secondary | ICD-10-CM

## 2019-07-03 DIAGNOSIS — D219 Benign neoplasm of connective and other soft tissue, unspecified: Secondary | ICD-10-CM

## 2019-07-03 DIAGNOSIS — N920 Excessive and frequent menstruation with regular cycle: Secondary | ICD-10-CM

## 2019-07-03 NOTE — Patient Instructions (Signed)
Laparoscopically Assisted Vaginal Hysterectomy, Care After This sheet gives you information about how to care for yourself after your procedure. Your health care provider may also give you more specific instructions. If you have problems or questions, contact your health care provider. What can I expect after the procedure? After the procedure, it is common to have:  Soreness and numbness in your incision areas.  Abdominal pain. You will be given pain medicine to control it.  Vaginal bleeding and discharge. You will need to use a sanitary napkin after this procedure.  Sore throat from the breathing tube that was inserted during surgery. Follow these instructions at home: Medicines  Take over-the-counter and prescription medicines only as told by your health care provider.  Do not take aspirin or ibuprofen. These medicines can cause bleeding.  Do not drive or use heavy machinery while taking prescription pain medicine.  Do not drive for 24 hours if you were given a medicine to help you relax (sedative) during the procedure. Incision care   Follow instructions from your health care provider about how to take care of your incisions. Make sure you: ? Wash your hands with soap and water before you change your bandage (dressing). If soap and water are not available, use hand sanitizer. ? Change your dressing as told by your health care provider. ? Leave stitches (sutures), skin glue, or adhesive strips in place. These skin closures may need to stay in place for 2 weeks or longer. If adhesive strip edges start to loosen and curl up, you may trim the loose edges. Do not remove adhesive strips completely unless your health care provider tells you to do that.  Check your incision area every day for signs of infection. Check for: ? Redness, swelling, or pain. ? Fluid or blood. ? Warmth. ? Pus or a bad smell. Activity  Get regular exercise as told by your health care provider. You may be  told to take short walks every day and go farther each time.  Return to your normal activities as told by your health care provider. Ask your health care provider what activities are safe for you.  Do not douche, use tampons, or have sexual intercourse for at least 6 weeks, or until your health care provider gives you permission.  Do not lift anything that is heavier than 10 lb (4.5 kg), or the limit that your health care provider tells you, until he or she says that it is safe. General instructions  Do not take baths, swim, or use a hot tub until your health care provider approves. Take showers instead of baths.  Do not drive for 24 hours if you received a sedative.  Do not drive or operate heavy machinery while taking prescription pain medicine.  To prevent or treat constipation while you are taking prescription pain medicine, your health care provider may recommend that you: ? Drink enough fluid to keep your urine clear or pale yellow. ? Take over-the-counter or prescription medicines. ? Eat foods that are high in fiber, such as fresh fruits and vegetables, whole grains, and beans. ? Limit foods that are high in fat and processed sugars, such as fried and sweet foods.  Keep all follow-up visits as told by your health care provider. This is important. Contact a health care provider if:  You have signs of infection, such as: ? Redness, swelling, or pain around your incision sites. ? Fluid or blood coming from an incision. ? An incision that feels warm to the   touch. ? Pus or a bad smell coming from an incision.  Your incision breaks open.  Your pain medicine is not helping.  You feel dizzy or light-headed.  You have pain or bleeding when you urinate.  You have persistent nausea and vomiting.  You have blood, pus, or a bad-smelling discharge from your vagina. Get help right away if:  You have a fever.  You have severe abdominal pain.  You have chest pain.  You have  shortness of breath.  You faint.  You have pain, swelling, or redness in your leg.  You have heavy bleeding from your vagina. Summary  After the procedure, it is common to have abdominal pain and vaginal bleeding.  You should not drive or lift heavy objects until your health care provider says that it is safe.  Contact your health care provider if you have any symptoms of infection, excessive vaginal bleeding, nausea, vomiting, or shortness of breath. This information is not intended to replace advice given to you by your health care provider. Make sure you discuss any questions you have with your health care provider. Document Revised: 01/25/2017 Document Reviewed: 04/10/2016 Elsevier Patient Education  2020 [employer].  Total Laparoscopic Hysterectomy A total laparoscopic hysterectomy is a minimally invasive surgery to remove the uterus and cervix. The fallopian tubes and ovaries can also be removed (bilateral salpingo-oophorectomy) during this surgery, if necessary. This procedure may be done to treat problems such as:  Noncancerous growths in the uterus (uterine fibroids) that cause symptoms.  A condition that causes the lining of the uterus (endometrium) to grow in other areas (endometriosis).  Problems with pelvic support. This is caused by weakened muscles of the pelvis following vaginal childbirth or menopause.  Cancer of the cervix, ovaries, uterus, or endometrium.  Excessive (dysfunctional) uterine bleeding. This surgery is performed by inserting a thin, lighted tube (laparoscope) and surgical instruments into small incisions in the abdomen. The laparoscope sends images to a monitor. The images help the health care provider perform the procedure. After this procedure, you will no longer be able to have a baby, and you will no longer have a menstrual period. Tell a health care provider about:  Any allergies you have.  All medicines you are taking, including vitamins,  herbs, eye drops, creams, and over-the-counter medicines.  Any problems you or family members have had with anesthetic medicines.  Any blood disorders you have.  Any surgeries you have had.  Any medical conditions you have.  Whether you are pregnant or may be pregnant. What are the risks? Generally, this is a safe procedure. However, problems may occur, including:  Infection.  Bleeding.  Blood clots in the legs or lungs.  Allergic reactions to medicines.  Damage to other structures or organs.  The risk that the surgery may have to be switched to the regular one in which a large incision is made in the abdomen (abdominal hysterectomy). What happens before the procedure? Staying hydrated Follow instructions from your health care provider about hydration, which may include:  Up to 2 hours before the procedure - you may continue to drink clear liquids, such as water, clear fruit juice, black coffee, and plain tea Eating and drinking restrictions Follow instructions from your health care provider about eating and drinking, which may include:  8 hours before the procedure - stop eating heavy meals or foods such as meat, fried foods, or fatty foods.  6 hours before the procedure - stop eating light meals or foods, such  as toast or cereal.  6 hours before the procedure - stop drinking milk or drinks that contain milk.  2 hours before the procedure - stop drinking clear liquids. Medicines  Ask your health care provider about: ? Changing or stopping your regular medicines. This is especially important if you are taking diabetes medicines or blood thinners. ? Taking over-the-counter medicines, vitamins, herbs, and supplements. ? Taking medicines such as aspirin and ibuprofen. These medicines can thin your blood. Do not take these medicines unless your health care provider tells you to take them.  You may be given antibiotic medicine to help prevent infection.  You may be asked  to take laxatives.  You may be given medicines to help prevent nausea and vomiting after the procedure. General instructions  Ask your health care provider how your surgical site will be marked or identified.  You may be asked to shower with a germ-killing soap.  Do not use any products that contain nicotine or tobacco, such as cigarettes and e-cigarettes. If you need help quitting, ask your health care provider.  You may have an exam or testing, such as an ultrasound to determine the size and shape of your pelvic organs.  You may have a blood or urine sample taken.  This procedure can affect the way you feel about yourself. Talk with your health care provider about the physical and emotional changes hysterectomy may cause.  Plan to have someone take you home from the hospital or clinic.  Plan to have a responsible adult care for you for at least 24 hours after you leave the hospital or clinic. This is important. What happens during the procedure?  To lower your risk of infection: ? Your health care team will wash or sanitize their hands. ? Your skin will be washed with soap. ? Hair may be removed from the surgical area.  An IV will be inserted into one of your veins.  You will be given one or more of the following: ? A medicine to help you relax (sedative). ? A medicine to make you fall asleep (general anesthetic).  You will be given antibiotic medicine through your IV.  A tube may be inserted down your throat to help you breathe during the procedure.  A gas (carbon dioxide) will be used to inflate your abdomen to allow your surgeon to see inside of your abdomen.  Three or four small incisions will be made in your abdomen.  A laparoscope will be inserted into one of your incisions. Surgical instruments will be inserted through the other incisions in order to perform the procedure.  Your uterus and cervix may be removed through your vagina or cut into small pieces and  removed through the small incisions. Any other organs that need to be removed will also be removed this way.  Carbon dioxide will be released from inside of your abdomen.  Your incisions will be closed with stitches (sutures).  A bandage (dressing) may be placed over your incisions. The procedure may vary among health care providers and hospitals. What happens after the procedure?  Your blood pressure, heart rate, breathing rate, and blood oxygen level will be monitored until the medicines you were given have worn off.  You will be given medicine for pain and nausea as needed.  Do not drive for 24 hours if you received a sedative. Summary  Total Laparoscopic hysterectomy is a procedure to remove your uterus, cervix and sometimes the fallopian tubes and ovaries.  This procedure can  affect the way you feel about yourself. Talk with your health care provider about the physical and emotional changes hysterectomy may cause.  After this procedure, you will no longer be able to have a baby, and you will no longer have a menstrual period.  You will be given pain medicine to control discomfort after this procedure. This information is not intended to replace advice given to you by your health care provider. Make sure you discuss any questions you have with your health care provider. Document Revised: 01/25/2017 Document Reviewed: 04/25/2016 Elsevier Patient Education  2020 [place] Patient Education  [employer].

## 2019-07-06 ENCOUNTER — Other Ambulatory Visit: Payer: Self-pay

## 2019-07-06 ENCOUNTER — Encounter
Admission: RE | Admit: 2019-07-06 | Discharge: 2019-07-06 | Disposition: A | Discharge status: Home / Self Care | Payer: BC Managed Care – PPO | Source: Ambulatory Visit | Attending: Urology | Admitting: Urology

## 2019-07-06 HISTORY — DX: Family history of other specified conditions: Z84.89

## 2019-07-06 NOTE — H&P (View-Only) (Signed)
Patient ID: Cheryl Schmidt, female   DOB: 31-Dec-1970, 49 y.o.   MRN: [id number]  Reason for Consult: Pre-op Exam   Referred by Pleas Koch, NP  Subjective:     HPI:  Cheryl Schmidt is a 49 y.o. female.  She is following up today for a preoperative visit.  She has a known fibroid uterus since 2018 and has a history of menorrhagia with a regular menstrual cycle.  She recently had an acute bleeding episode and now desires to have a hysterectomy for definitve management of her menorrhagia.  Since she has an upcoming menstrual cycle she will be taking provera up until the day of surgery to prevent an episode of heavy bleeding.   Past Medical History:  Diagnosis Date  . Family history of adverse reaction to anesthesia    mother has N/V  . Fibroids   . Headache   . Melanoma ([place]) 2001   upper abdomen Dr Nehemiah Massed removed  . Menorrhagia with regular cycle   . Ovarian cyst   . Uterine bleeding    Family History  Problem Relation Age of Onset  . Hypertension Father   . Diabetes Father   . Hyperlipidemia Brother   . Hypertension Brother   . Colon cancer Paternal Grandfather 69  . Colon cancer Paternal Uncle 65  . Breast cancer Neg Hx    Past Surgical History:  Procedure Laterality Date  . COLONOSCOPY WITH PROPOFOL N/A 10/10/2018   Procedure: COLONOSCOPY WITH PROPOFOL;  Surgeon: Lin Landsman, MD;  Location: [facility] ENDOSCOPY;  Service: Gastroenterology;  Laterality: N/A;  . Excision of melanoma  2001  . INTRAUTERINE DEVICE (IUD) INSERTION  2009  . IUD REMOVAL  2014  . WISDOM TOOTH EXTRACTION      Short Social History:  Social History   Tobacco Use  . Smoking status: Never Smoker  . Smokeless tobacco: Never Used  Substance Use Topics  . Alcohol use: Yes    Comment: 1 glass of wine/month    Allergies  Allergen Reactions  . Promethazine Other (See Comments)    uncontrollable reflex    Current Outpatient Medications  Medication Sig Dispense Refill  . ibuprofen  (ADVIL) 200 MG tablet Take 400 mg by mouth every 6 (six) hours as needed for moderate pain.    . medroxyPROGESTERone (PROVERA) 10 MG tablet Take 1 tablet (10 mg total) by mouth in the morning, at noon, and at bedtime for 16 days. 48 tablet 0  . Multiple Vitamin (MULTIVITAMIN WITH MINERALS) TABS tablet Take 1 tablet by mouth daily.     No current facility-administered medications for this visit.    Review of Systems  Constitutional: Negative for chills, fatigue, fever and unexpected weight change.  HENT: Negative for trouble swallowing.  Eyes: Negative for loss of vision.  Respiratory: Negative for cough, shortness of breath and wheezing.  Cardiovascular: Negative for chest pain, leg swelling, palpitations and syncope.  GI: Negative for abdominal pain, blood in stool, diarrhea, nausea and vomiting.  GU: Negative for difficulty urinating, dysuria, frequency and hematuria.  Musculoskeletal: Negative for back pain, leg pain and joint pain.  Skin: Negative for rash.  Neurological: Negative for dizziness, headaches, light-headedness, numbness and seizures.  Psychiatric: Negative for behavioral problem, confusion, depressed mood and sleep disturbance.        Objective:  Objective   Vitals:   07/03/19 0835  BP: 110/78  Weight: 160 lb (72.6 kg)  Height: 5\' 4"  (1.626 m)   Body mass index  is 27.46 kg/m.  Physical Exam Vitals and nursing note reviewed.  Constitutional:      Appearance: She is well-developed.  HENT:     Head: Normocephalic and atraumatic.  Eyes:     Pupils: Pupils are equal, round, and reactive to light.  Cardiovascular:     Rate and Rhythm: Normal rate and regular rhythm.  Pulmonary:     Effort: Pulmonary effort is normal. No respiratory distress.  Skin:    General: Skin is warm and dry.  Neurological:     Mental Status: She is alert and oriented to person, place, and time.  Psychiatric:        Behavior: Behavior normal.        Thought Content: Thought content  normal.        Judgment: Judgment normal.         Assessment/Plan:     49 yo with enlarged fibroid uterus and menorrhagia.  Scheduled for robotic assisted vaginal hysterectomy with morcellation, bilateral salpingectomy, cystoscopy and indocyanine green instillation.Surgery has been planned with urology and gynecological oncology.   I have had a careful discussion with this patient about all the options available and the risk/benefits of each. I have fully informed this patient that a hysterectomy may subject her to a variety of discomforts and risks: She understands that most patients have surgery with little difficulty, but problems can happen ranging from minor to fatal. These include nausea, vomiting, pain, bleeding, infection, poor healing, hernia, wound separation, vaginal cuff separation or formation of adhesions. Unexpected reactions may occur from any drug or anesthetic given. Unintended injury may occur to other pelvic or abdominal structures such as Fallopian tubes, ovaries, bladder, ureter (tube from kidney to bladder), or bowel. Nerves going from the pelvis to the legs may be injured. Any such injury may require immediate or later additional surgery to correct the problem. Excessive blood loss requiring transfusion is very unlikely but possible. Dangerous blood clots may form in the legs or lungs. Physical and sexual activity will be restricted in varying degrees for an indeterminate period of time but most often 2-12 weeks. She understands that the plan is to do this laparoscopically, however, there is a chance that this will need to be performed via a larger incision.  We discussed morcellation of the uterus and uterine fibroids. She understands that in the rare event of a leiomyosarcoma that morcellation of the uterus could seed the abdominal cavity with cancer resulting in a increased cancer stage and cancer morbidity. She gave consent to proceed with morcellation. She may be  hospitalized overnight. Finally, she understands that it is impossible to list every possible undesirable effect and that the condition for which surgery is done is not always cured or significantly improved, and in rare cases may be even worse. I have also counseled her extensively about the pros and cons of ovarian conservation versus removal. Ample time was given to answer all questions.    More than 40 minutes were spent face to face with the patient in the room, reviewing the medical record, labs and images, and coordinating care for the patient. The plan of management was discussed in detail and counseling was provided.   Adrian Prows MD [facility], [facility] 07/06/2019 3:29 PM

## 2019-07-06 NOTE — Patient Instructions (Addendum)
COVID TESTING Date: Monday, May 17 Testing site:  [facility] [address] Thru Hours:  [id number] am - 1:00 pm Once you are tested, you are asked to stay quarantined (avoiding public places) until after your surgery.  Your procedure is scheduled on: Wednesday, May 19 Report to Day Surgery on the 2nd floor of the [facility]. To find out your arrival time, please call [phone] between 1PM - 3PM on: Tuesday, May 18  REMEMBER: Instructions that are not followed completely may result in serious medical risk, up to and including death; or upon the discretion of your surgeon and anesthesiologist your surgery may need to be rescheduled.  Do not eat food after midnight the night before surgery.  No gum chewing, lozengers or hard candies.  You may however, drink CLEAR liquids up to 2 hours before you are scheduled to arrive for your surgery. Do not drink anything within 2 hours of your scheduled arrival time.  Clear liquids include: - water  - apple juice without pulp - gatorade (not RED) - black coffee or tea (Do NOT add milk or creamers to the coffee or tea) Do NOT drink anything that is not on this list.  ENSURE PRE-SURGERY CARBOHYDRATE DRINK:  Complete drinking 2 hours prior to scheduled arrival time.  DO NOT TAKE ANY MEDICATIONS THE MORNING OF SURGERY  Stop Anti-inflammatories (NSAIDS) such as Advil, Aleve, Ibuprofen, Motrin, Naproxen, Naprosyn and Aspirin based products such as Excedrin, Goodys Powder, BC Powder. (May take Tylenol or Acetaminophen if needed.)  Stop ANY OVER THE COUNTER supplements until after surgery. (May continue multivitamin.)  No Alcohol for 24 hours before or after surgery.  On the morning of surgery brush your teeth with toothpaste and water, you may rinse your mouth with mouthwash if you wish. Do not swallow any toothpaste or mouthwash.  Do not wear jewelry, make-up, hairpins, clips or nail polish.  Do not wear lotions,  powders, or perfumes.   Do not shave 48 hours prior to surgery.   Do not bring valuables to the hospital. [facility] is not responsible for any missing/lost belongings or valuables.   Use CHG Soap as directed on instruction sheet.  Notify your doctor if there is any change in your medical condition (cold, fever, infection).  Wear comfortable clothing (specific to your surgery type) to the hospital.  Plan for stool softeners for home use; pain medications have a tendency to cause constipation. You can also help prevent constipation by eating foods high in fiber such as fruits and vegetables and drinking plenty of fluids as your diet allows.  After surgery, you can help prevent lung complications by doing breathing exercises.  Take deep breaths and cough every 1-2 hours. Your doctor may order a device called an Incentive Spirometer to help you take deep breaths. When coughing or sneezing, hold a pillow firmly against your incision with both hands. This is called "splinting." Doing this helps protect your incision. It also decreases belly discomfort.  If you are being admitted to the hospital overnight, leave your suitcase in the car. After surgery it may be brought to your room.  If you are being discharged the day of surgery, you will not be allowed to drive home. You will need a responsible adult (18 years or older) to drive you home and stay with you that night.   Please call the [place] [facility] at [phone] if you have any questions about these instructions.  Visitation Policy:  Patients undergoing a surgery or procedure may have one family member or support person with them as long as that person is not COVID-19 positive or experiencing its symptoms.  That person may remain in the waiting area during the procedure.  Inpatient Visitation Update:  Two designated support people may visit a patient during visiting hours 7 am to 8 pm. It must be the same two  designated people for the duration of the patient stay. The visitors may come and go during the day, and there is no switching out to have different visitors. A mask must be worn at all times, including in the patient room.

## 2019-07-06 NOTE — Progress Notes (Signed)
Patient ID: Cheryl Schmidt   DOB: 1970-03-07, 49 y.o.   MRN: [id number]  Reason for Consult: Pre-op Exam   Referred by Pleas Koch, NP  Subjective:     HPI:  Cheryl Schmidt is a 49 y.o. Schmidt.  She is following up today for a preoperative visit.  She has a known fibroid uterus since 2018 and has a history of menorrhagia with a regular menstrual cycle.  She recently had an acute bleeding episode and now desires to have a hysterectomy for definitve management of her menorrhagia.  Since she has an upcoming menstrual cycle she will be taking provera up until the day of surgery to prevent an episode of heavy bleeding.   Past Medical History:  Diagnosis Date  . Family history of adverse reaction to anesthesia    mother has N/V  . Fibroids   . Headache   . Melanoma ([place]) 2001   upper abdomen Dr Nehemiah Massed removed  . Menorrhagia with regular cycle   . Ovarian cyst   . Uterine bleeding    Family History  Problem Relation Age of Onset  . Hypertension Father   . Diabetes Father   . Hyperlipidemia Brother   . Hypertension Brother   . Colon cancer Paternal Grandfather 12  . Colon cancer Paternal Uncle 2  . Breast cancer Neg Hx    Past Surgical History:  Procedure Laterality Date  . COLONOSCOPY WITH PROPOFOL N/A 10/10/2018   Procedure: COLONOSCOPY WITH PROPOFOL;  Surgeon: Lin Landsman, MD;  Location: [facility] ENDOSCOPY;  Service: Gastroenterology;  Laterality: N/A;  . Excision of melanoma  2001  . INTRAUTERINE DEVICE (IUD) INSERTION  2009  . IUD REMOVAL  2014  . WISDOM TOOTH EXTRACTION      Short Social History:  Social History   Tobacco Use  . Smoking status: Never Smoker  . Smokeless tobacco: Never Used  Substance Use Topics  . Alcohol use: Yes    Comment: 1 glass of wine/month    Allergies  Allergen Reactions  . Promethazine Other (See Comments)    uncontrollable reflex    Current Outpatient Medications  Medication Sig Dispense Refill  . ibuprofen  (ADVIL) 200 MG tablet Take 400 mg by mouth every 6 (six) hours as needed for moderate pain.    . medroxyPROGESTERone (PROVERA) 10 MG tablet Take 1 tablet (10 mg total) by mouth in the morning, at noon, and at bedtime for 16 days. 48 tablet 0  . Multiple Vitamin (MULTIVITAMIN WITH MINERALS) TABS tablet Take 1 tablet by mouth daily.     No current facility-administered medications for this visit.    Review of Systems  Constitutional: Negative for chills, fatigue, fever and unexpected weight change.  HENT: Negative for trouble swallowing.  Eyes: Negative for loss of vision.  Respiratory: Negative for cough, shortness of breath and wheezing.  Cardiovascular: Negative for chest pain, leg swelling, palpitations and syncope.  GI: Negative for abdominal pain, blood in stool, diarrhea, nausea and vomiting.  GU: Negative for difficulty urinating, dysuria, frequency and hematuria.  Musculoskeletal: Negative for back pain, leg pain and joint pain.  Skin: Negative for rash.  Neurological: Negative for dizziness, headaches, light-headedness, numbness and seizures.  Psychiatric: Negative for behavioral problem, confusion, depressed mood and sleep disturbance.        Objective:  Objective   Vitals:   07/03/19 0835  BP: 110/78  Weight: 160 lb (72.6 kg)  Height: 5\' 4"  (1.626 m)   Body mass index  is 27.46 kg/m.  Physical Exam Vitals and nursing note reviewed.  Constitutional:      Appearance: She is well-developed.  HENT:     Head: Normocephalic and atraumatic.  Eyes:     Pupils: Pupils are equal, round, and reactive to light.  Cardiovascular:     Rate and Rhythm: Normal rate and regular rhythm.  Pulmonary:     Effort: Pulmonary effort is normal. No respiratory distress.  Skin:    General: Skin is warm and dry.  Neurological:     Mental Status: She is alert and oriented to person, place, and time.  Psychiatric:        Behavior: Behavior normal.        Thought Content: Thought content  normal.        Judgment: Judgment normal.         Assessment/Plan:     49 yo with enlarged fibroid uterus and menorrhagia.  Scheduled for robotic assisted vaginal hysterectomy with morcellation, bilateral salpingectomy, cystoscopy and indocyanine green instillation.Surgery has been planned with urology and gynecological oncology.   I have had a careful discussion with this patient about all the options available and the risk/benefits of each. I have fully informed this patient that a hysterectomy may subject her to a variety of discomforts and risks: She understands that most patients have surgery with little difficulty, but problems can happen ranging from minor to fatal. These include nausea, vomiting, pain, bleeding, infection, poor healing, hernia, wound separation, vaginal cuff separation or formation of adhesions. Unexpected reactions may occur from any drug or anesthetic given. Unintended injury may occur to other pelvic or abdominal structures such as Fallopian tubes, ovaries, bladder, ureter (tube from kidney to bladder), or bowel. Nerves going from the pelvis to the legs may be injured. Any such injury may require immediate or later additional surgery to correct the problem. Excessive blood loss requiring transfusion is very unlikely but possible. Dangerous blood clots may form in the legs or lungs. Physical and sexual activity will be restricted in varying degrees for an indeterminate period of time but most often 2-12 weeks. She understands that the plan is to do this laparoscopically, however, there is a chance that this will need to be performed via a larger incision.  We discussed morcellation of the uterus and uterine fibroids. She understands that in the rare event of a leiomyosarcoma that morcellation of the uterus could seed the abdominal cavity with cancer resulting in a increased cancer stage and cancer morbidity. She gave consent to proceed with morcellation. She may be  hospitalized overnight. Finally, she understands that it is impossible to list every possible undesirable effect and that the condition for which surgery is done is not always cured or significantly improved, and in rare cases may be even worse. I have also counseled her extensively about the pros and cons of ovarian conservation versus removal. Ample time was given to answer all questions.    More than 40 minutes were spent face to face with the patient in the room, reviewing the medical record, labs and images, and coordinating care for the patient. The plan of management was discussed in detail and counseling was provided.   Adrian Prows MD [facility], [facility] 07/06/2019 3:29 PM

## 2019-07-13 ENCOUNTER — Other Ambulatory Visit
Admission: RE | Admit: 2019-07-13 | Discharge: 2019-07-13 | Disposition: A | Discharge status: Home / Self Care | Payer: BC Managed Care – PPO | Source: Ambulatory Visit | Attending: Obstetrics and Gynecology | Admitting: Obstetrics and Gynecology

## 2019-07-13 ENCOUNTER — Other Ambulatory Visit: Payer: Self-pay

## 2019-07-13 DIAGNOSIS — Z85820 Personal history of malignant melanoma of skin: Secondary | ICD-10-CM | POA: Diagnosis not present

## 2019-07-13 DIAGNOSIS — Z20822 Contact with and (suspected) exposure to covid-19: Secondary | ICD-10-CM | POA: Diagnosis not present

## 2019-07-13 DIAGNOSIS — N939 Abnormal uterine and vaginal bleeding, unspecified: Secondary | ICD-10-CM | POA: Diagnosis not present

## 2019-07-13 DIAGNOSIS — Z408 Encounter for other prophylactic surgery: Secondary | ICD-10-CM | POA: Diagnosis not present

## 2019-07-13 DIAGNOSIS — N920 Excessive and frequent menstruation with regular cycle: Secondary | ICD-10-CM | POA: Diagnosis not present

## 2019-07-13 DIAGNOSIS — R309 Painful micturition, unspecified: Secondary | ICD-10-CM | POA: Diagnosis not present

## 2019-07-13 DIAGNOSIS — D62 Acute posthemorrhagic anemia: Secondary | ICD-10-CM | POA: Diagnosis not present

## 2019-07-13 DIAGNOSIS — Z01812 Encounter for preprocedural laboratory examination: Secondary | ICD-10-CM | POA: Insufficient documentation

## 2019-07-13 DIAGNOSIS — D259 Leiomyoma of uterus, unspecified: Secondary | ICD-10-CM | POA: Diagnosis not present

## 2019-07-13 DIAGNOSIS — D251 Intramural leiomyoma of uterus: Secondary | ICD-10-CM | POA: Diagnosis not present

## 2019-07-13 LAB — TYPE AND SCREEN
ABO/RH(D): A POS
Antibody Screen: NEGATIVE
Extend sample reason: TRANSFUSED

## 2019-07-13 LAB — COMPREHENSIVE METABOLIC PANEL
ALT: 21 U/L (ref 0–44)
AST: 20 U/L (ref 15–41)
Albumin: 4.1 g/dL (ref 3.5–5.0)
Alkaline Phosphatase: 96 U/L (ref 38–126)
Anion gap: 10 (ref 5–15)
BUN: 8 mg/dL (ref 6–20)
CO2: 21 mmol/L — ABNORMAL LOW (ref 22–32)
Calcium: 9.1 mg/dL (ref 8.9–10.3)
Chloride: 107 mmol/L (ref 98–111)
Creatinine, Ser: 0.59 mg/dL (ref 0.44–1.00)
GFR calc Af Amer: >60 mL/min (ref >60)
GFR calc non Af Amer: >60 mL/min (ref >60)
Glucose, Bld: 95 mg/dL (ref 70–99)
Potassium: 3.7 mmol/L (ref 3.5–5.1)
Sodium: 138 mmol/L (ref 135–145)
Total Bilirubin: 1 mg/dL (ref 0.3–1.2)
Total Protein: 7.6 g/dL (ref 6.5–8.1)

## 2019-07-13 LAB — CBC
HCT: 37.8 % (ref 36.0–46.0)
Hemoglobin: 12.3 g/dL (ref 12.0–15.0)
MCH: 26.6 pg (ref 26.0–34.0)
MCHC: 32.5 g/dL (ref 30.0–36.0)
MCV: 81.8 fL (ref 80.0–100.0)
Platelets: 364 10*3/uL (ref 150–400)
RBC: 4.62 MIL/uL (ref 3.87–5.11)
RDW: 15.7 % — ABNORMAL HIGH (ref 11.5–15.5)
WBC: 6.6 10*3/uL (ref 4.0–10.5)
nRBC: 0 % (ref 0.0–0.2)

## 2019-07-13 LAB — SARS CORONAVIRUS 2 (TAT 6-24 HRS): SARS Coronavirus 2: NEGATIVE

## 2019-07-14 ENCOUNTER — Other Ambulatory Visit: Payer: BC Managed Care – PPO

## 2019-07-15 ENCOUNTER — Observation Stay: Payer: BC Managed Care – PPO | Admitting: Registered Nurse

## 2019-07-15 ENCOUNTER — Encounter
Admission: AD | Disposition: A | Discharge status: Home / Self Care | Payer: Self-pay | Source: Home / Self Care | Attending: Obstetrics and Gynecology

## 2019-07-15 ENCOUNTER — Encounter: Payer: Self-pay | Admitting: Obstetrics and Gynecology

## 2019-07-15 ENCOUNTER — Inpatient Hospital Stay
Admission: AD | Admit: 2019-07-15 | Discharge: 2019-07-17 | DRG: 742 | Disposition: A | Discharge status: Home / Self Care | Payer: BC Managed Care – PPO | Attending: Obstetrics and Gynecology | Admitting: Obstetrics and Gynecology

## 2019-07-15 ENCOUNTER — Other Ambulatory Visit: Payer: Self-pay

## 2019-07-15 ENCOUNTER — Observation Stay: Payer: BC Managed Care – PPO

## 2019-07-15 DIAGNOSIS — N939 Abnormal uterine and vaginal bleeding, unspecified: Secondary | ICD-10-CM | POA: Diagnosis not present

## 2019-07-15 DIAGNOSIS — D259 Leiomyoma of uterus, unspecified: Secondary | ICD-10-CM | POA: Diagnosis not present

## 2019-07-15 DIAGNOSIS — D62 Acute posthemorrhagic anemia: Secondary | ICD-10-CM | POA: Diagnosis not present

## 2019-07-15 DIAGNOSIS — N920 Excessive and frequent menstruation with regular cycle: Secondary | ICD-10-CM | POA: Diagnosis not present

## 2019-07-15 DIAGNOSIS — Z408 Encounter for other prophylactic surgery: Secondary | ICD-10-CM | POA: Diagnosis not present

## 2019-07-15 DIAGNOSIS — Z90710 Acquired absence of both cervix and uterus: Secondary | ICD-10-CM | POA: Diagnosis present

## 2019-07-15 DIAGNOSIS — Z85820 Personal history of malignant melanoma of skin: Secondary | ICD-10-CM

## 2019-07-15 DIAGNOSIS — R309 Painful micturition, unspecified: Secondary | ICD-10-CM | POA: Diagnosis not present

## 2019-07-15 DIAGNOSIS — D251 Intramural leiomyoma of uterus: Secondary | ICD-10-CM | POA: Diagnosis not present

## 2019-07-15 DIAGNOSIS — Z20822 Contact with and (suspected) exposure to covid-19: Secondary | ICD-10-CM | POA: Diagnosis present

## 2019-07-15 HISTORY — PX: CYSTOSCOPY W/ URETERAL STENT PLACEMENT: SHX1429

## 2019-07-15 HISTORY — PX: CYSTOSCOPY: SHX5120

## 2019-07-15 HISTORY — DX: Acquired absence of both cervix and uterus: Z90.710

## 2019-07-15 HISTORY — PX: ROBOTIC ASSISTED TOTAL HYSTERECTOMY WITH BILATERAL SALPINGO OOPHERECTOMY: SHX6086

## 2019-07-15 LAB — POCT PREGNANCY, URINE: Preg Test, Ur: NEGATIVE

## 2019-07-15 SURGERY — CYSTOSCOPY
Anesthesia: Choice

## 2019-07-15 SURGERY — INDOCYANINE GREEN FLUORESCENCE IMAGING (ICG)
Anesthesia: General | Site: Ureter | Laterality: Left

## 2019-07-15 MED ORDER — METOCLOPRAMIDE HCL 10 MG PO TABS
10.0000 mg | ORAL_TABLET | Freq: Three times a day (TID) | ORAL | Status: DC
  Administered 2019-07-15: 10 mg via ORAL
  Filled 2019-07-15: qty 1

## 2019-07-15 MED ORDER — HYDROMORPHONE HCL 1 MG/ML IJ SOLN
0.2000 mg | INTRAMUSCULAR | Status: DC | PRN
  Administered 2019-07-15 (×2): 0.5 mg via INTRAVENOUS
  Administered 2019-07-15: 0.6 mg via INTRAVENOUS
  Administered 2019-07-16 (×2): 0.5 mg via INTRAVENOUS
  Filled 2019-07-15 (×5): qty 1

## 2019-07-15 MED ORDER — MIDAZOLAM HCL 2 MG/2ML IJ SOLN
INTRAMUSCULAR | Status: DC | PRN
  Administered 2019-07-15: 2 mg via INTRAVENOUS

## 2019-07-15 MED ORDER — POLYETHYLENE GLYCOL 3350 17 G PO PACK
17.0000 g | PACK | Freq: Every day | ORAL | Status: DC | PRN
  Administered 2019-07-17: 17 g via ORAL
  Filled 2019-07-15 (×2): qty 1

## 2019-07-15 MED ORDER — FENTANYL CITRATE (PF) 100 MCG/2ML IJ SOLN
25.0000 ug | INTRAMUSCULAR | Status: AC | PRN
  Administered 2019-07-15 (×6): 25 ug via INTRAVENOUS

## 2019-07-15 MED ORDER — ONDANSETRON HCL 4 MG PO TABS: 4.0000 mg | ORAL_TABLET | Freq: Four times a day (QID) | ORAL | Status: DC | PRN

## 2019-07-15 MED ORDER — INDOCYANINE GREEN 25 MG IV SOLR
INTRAVENOUS | Status: DC | PRN
  Administered 2019-07-15: 50 mg

## 2019-07-15 MED ORDER — DOCUSATE SODIUM 100 MG PO CAPS
100.0000 mg | ORAL_CAPSULE | Freq: Two times a day (BID) | ORAL | Status: DC
  Administered 2019-07-16 – 2019-07-17 (×2): 100 mg via ORAL
  Filled 2019-07-15 (×3): qty 1

## 2019-07-15 MED ORDER — SUGAMMADEX SODIUM 200 MG/2ML IV SOLN
INTRAVENOUS | Status: DC | PRN
  Administered 2019-07-15: 200 mg via INTRAVENOUS

## 2019-07-15 MED ORDER — OXYBUTYNIN CHLORIDE 5 MG PO TABS
5.0000 mg | ORAL_TABLET | Freq: Three times a day (TID) | ORAL | 0 refills | Status: DC | PRN
Start: 2019-07-15 — End: 2019-09-08

## 2019-07-15 MED ORDER — PROPOFOL 10 MG/ML IV BOLUS
INTRAVENOUS | Status: AC
  Filled 2019-07-15: qty 20

## 2019-07-15 MED ORDER — FENTANYL CITRATE (PF) 100 MCG/2ML IJ SOLN
INTRAMUSCULAR | Status: AC
  Administered 2019-07-15: 25 ug via INTRAVENOUS
  Filled 2019-07-15: qty 2

## 2019-07-15 MED ORDER — LACTATED RINGERS IV SOLN
INTRAVENOUS | Status: DC
  Administered 2019-07-15: 125 mL/h via INTRAVENOUS

## 2019-07-15 MED ORDER — FAMOTIDINE 20 MG PO TABS
ORAL_TABLET | ORAL | Status: AC
  Administered 2019-07-15: 20 mg via ORAL
  Filled 2019-07-15: qty 1

## 2019-07-15 MED ORDER — ONDANSETRON HCL 4 MG/2ML IJ SOLN
4.0000 mg | Freq: Four times a day (QID) | INTRAMUSCULAR | Status: DC | PRN
  Administered 2019-07-15 (×2): 4 mg via INTRAVENOUS
  Filled 2019-07-15 (×2): qty 2

## 2019-07-15 MED ORDER — BISACODYL 10 MG RE SUPP: 10.0000 mg | Freq: Every day | RECTAL | Status: DC | PRN

## 2019-07-15 MED ORDER — IBUPROFEN 800 MG PO TABS: 800.0000 mg | ORAL_TABLET | Freq: Three times a day (TID) | ORAL | Status: DC

## 2019-07-15 MED ORDER — PANTOPRAZOLE SODIUM 40 MG PO TBEC
40.0000 mg | DELAYED_RELEASE_TABLET | Freq: Every day | ORAL | Status: DC
  Administered 2019-07-15 – 2019-07-17 (×3): 40 mg via ORAL
  Filled 2019-07-15 (×3): qty 1

## 2019-07-15 MED ORDER — DEXAMETHASONE SODIUM PHOSPHATE 10 MG/ML IJ SOLN
INTRAMUSCULAR | Status: AC
  Filled 2019-07-15: qty 1

## 2019-07-15 MED ORDER — FENTANYL CITRATE (PF) 250 MCG/5ML IJ SOLN
INTRAMUSCULAR | Status: AC
  Filled 2019-07-15: qty 5

## 2019-07-15 MED ORDER — ACETAMINOPHEN 10 MG/ML IV SOLN
INTRAVENOUS | Status: DC | PRN
  Administered 2019-07-15: 1000 mg via INTRAVENOUS

## 2019-07-15 MED ORDER — KETOROLAC TROMETHAMINE 30 MG/ML IJ SOLN
INTRAMUSCULAR | Status: DC | PRN
  Administered 2019-07-15: 30 mg via INTRAVENOUS

## 2019-07-15 MED ORDER — SUCCINYLCHOLINE CHLORIDE 200 MG/10ML IV SOSY
PREFILLED_SYRINGE | INTRAVENOUS | Status: AC
  Filled 2019-07-15: qty 10

## 2019-07-15 MED ORDER — PROPOFOL 500 MG/50ML IV EMUL
INTRAVENOUS | Status: AC
  Filled 2019-07-15: qty 50

## 2019-07-15 MED ORDER — KETOROLAC TROMETHAMINE 30 MG/ML IJ SOLN
INTRAMUSCULAR | Status: AC
  Filled 2019-07-15: qty 1

## 2019-07-15 MED ORDER — ACETAMINOPHEN 10 MG/ML IV SOLN
INTRAVENOUS | Status: AC
  Filled 2019-07-15: qty 100

## 2019-07-15 MED ORDER — ONDANSETRON HCL 4 MG/2ML IJ SOLN: 4.0000 mg | Freq: Once | INTRAMUSCULAR | Status: AC | PRN

## 2019-07-15 MED ORDER — FENTANYL CITRATE (PF) 100 MCG/2ML IJ SOLN
INTRAMUSCULAR | Status: DC | PRN
  Administered 2019-07-15 (×2): 50 ug via INTRAVENOUS
  Administered 2019-07-15: 150 ug via INTRAVENOUS

## 2019-07-15 MED ORDER — DEXAMETHASONE SODIUM PHOSPHATE 10 MG/ML IJ SOLN
INTRAMUSCULAR | Status: DC | PRN
  Administered 2019-07-15: 10 mg via INTRAVENOUS

## 2019-07-15 MED ORDER — BUPIVACAINE HCL 0.5 % IJ SOLN
INTRAMUSCULAR | Status: DC | PRN
  Administered 2019-07-15: 17 mL

## 2019-07-15 MED ORDER — LIDOCAINE HCL (CARDIAC) PF 100 MG/5ML IV SOSY
PREFILLED_SYRINGE | INTRAVENOUS | Status: DC | PRN
  Administered 2019-07-15: 100 mg via INTRAVENOUS

## 2019-07-15 MED ORDER — FAMOTIDINE 20 MG PO TABS: 20.0000 mg | ORAL_TABLET | Freq: Once | ORAL | Status: AC

## 2019-07-15 MED ORDER — ONDANSETRON HCL 4 MG/2ML IJ SOLN
INTRAMUSCULAR | Status: AC
  Filled 2019-07-15: qty 2

## 2019-07-15 MED ORDER — PROPOFOL 10 MG/ML IV BOLUS
INTRAVENOUS | Status: DC | PRN
  Administered 2019-07-15: 160 mg via INTRAVENOUS

## 2019-07-15 MED ORDER — PHENYLEPHRINE HCL (PRESSORS) 10 MG/ML IV SOLN
INTRAVENOUS | Status: DC | PRN
  Administered 2019-07-15 (×3): 100 ug via INTRAVENOUS

## 2019-07-15 MED ORDER — ROCURONIUM BROMIDE 100 MG/10ML IV SOLN
INTRAVENOUS | Status: DC | PRN
  Administered 2019-07-15: 30 mg via INTRAVENOUS
  Administered 2019-07-15: 50 mg via INTRAVENOUS
  Administered 2019-07-15: 20 mg via INTRAVENOUS

## 2019-07-15 MED ORDER — ONDANSETRON HCL 4 MG/2ML IJ SOLN
INTRAMUSCULAR | Status: AC
  Administered 2019-07-15: 4 mg via INTRAVENOUS
  Filled 2019-07-15: qty 2

## 2019-07-15 MED ORDER — MIDAZOLAM HCL 2 MG/2ML IJ SOLN
INTRAMUSCULAR | Status: AC
  Filled 2019-07-15: qty 2

## 2019-07-15 MED ORDER — ACETAMINOPHEN 500 MG PO TABS
1000.0000 mg | ORAL_TABLET | Freq: Four times a day (QID) | ORAL | Status: DC
  Administered 2019-07-15 – 2019-07-17 (×5): 1000 mg via ORAL
  Filled 2019-07-15 (×6): qty 2

## 2019-07-15 MED ORDER — ONDANSETRON HCL 4 MG/2ML IJ SOLN
INTRAMUSCULAR | Status: DC | PRN
  Administered 2019-07-15: 4 mg via INTRAVENOUS

## 2019-07-15 MED ORDER — TAMSULOSIN HCL 0.4 MG PO CAPS
0.4000 mg | ORAL_CAPSULE | Freq: Every day | ORAL | 0 refills | Status: DC
Start: 2019-07-15 — End: 2019-09-08

## 2019-07-15 MED ORDER — LIDOCAINE HCL (PF) 2 % IJ SOLN
INTRAMUSCULAR | Status: AC
  Filled 2019-07-15: qty 5

## 2019-07-15 MED ORDER — CEFAZOLIN SODIUM-DEXTROSE 2-4 GM/100ML-% IV SOLN
INTRAVENOUS | Status: AC
  Filled 2019-07-15: qty 100

## 2019-07-15 MED ORDER — ROCURONIUM BROMIDE 10 MG/ML (PF) SYRINGE
PREFILLED_SYRINGE | INTRAVENOUS | Status: AC
  Filled 2019-07-15: qty 10

## 2019-07-15 MED ORDER — LACTATED RINGERS IV SOLN: INTRAVENOUS | Status: DC

## 2019-07-15 MED ORDER — PHENYLEPHRINE HCL (PRESSORS) 10 MG/ML IV SOLN
INTRAVENOUS | Status: AC
  Filled 2019-07-15: qty 1

## 2019-07-15 MED ORDER — IBUPROFEN 800 MG PO TABS
800.0000 mg | ORAL_TABLET | Freq: Three times a day (TID) | ORAL | Status: DC
  Administered 2019-07-15: 800 mg via ORAL
  Filled 2019-07-15: qty 1

## 2019-07-15 MED ORDER — SIMETHICONE 80 MG PO CHEW
80.0000 mg | CHEWABLE_TABLET | Freq: Four times a day (QID) | ORAL | Status: DC
  Administered 2019-07-15 – 2019-07-17 (×5): 80 mg via ORAL
  Filled 2019-07-15 (×7): qty 1

## 2019-07-15 MED ORDER — IOHEXOL 180 MG/ML  SOLN
INTRAMUSCULAR | Status: DC | PRN
  Administered 2019-07-15: 20 mL

## 2019-07-15 MED ORDER — BUPIVACAINE HCL (PF) 0.5 % IJ SOLN
INTRAMUSCULAR | Status: AC
  Filled 2019-07-15: qty 30

## 2019-07-15 MED ORDER — OXYCODONE HCL 5 MG PO TABS: 5.0000 mg | ORAL_TABLET | ORAL | Status: DC | PRN

## 2019-07-15 MED ORDER — PROPOFOL 500 MG/50ML IV EMUL
INTRAVENOUS | Status: DC | PRN
  Administered 2019-07-15: 20 ug/kg/min via INTRAVENOUS

## 2019-07-15 MED ORDER — CEFAZOLIN SODIUM-DEXTROSE 2-4 GM/100ML-% IV SOLN
2.0000 g | INTRAVENOUS | Status: AC
  Administered 2019-07-15: 2 g via INTRAVENOUS

## 2019-07-15 SURGICAL SUPPLY — 99 items
ADAPTER CATH URET CONN 4-6FR (ADAPTER) IMPLANT
ADAPTER GOLDBERG URETERAL (ADAPTER) IMPLANT
ANCHOR TIS RET SYS 1550ML (BAG) ×4 IMPLANT
BAG BILE T-TUBES STRL (MISCELLANEOUS) IMPLANT
BAG DRAIN CYSTO-URO LG1000N (MISCELLANEOUS) ×4 IMPLANT
BAG URINE DRAIN 2000ML AR STRL (UROLOGICAL SUPPLIES) ×7 IMPLANT
BAG URO DRAIN 2000ML W/SPOUT (MISCELLANEOUS) ×4 IMPLANT
BLADE SURG SZ11 CARB STEEL (BLADE) ×7 IMPLANT
BRUSH SCRUB EZ  4% CHG (MISCELLANEOUS) ×1
BRUSH SCRUB EZ 4% CHG (MISCELLANEOUS) ×3 IMPLANT
CANISTER SUCT 1200ML W/VALVE (MISCELLANEOUS) ×4 IMPLANT
CANNULA SEALS 8.5MM (CANNULA) ×3
CATH FOLEY 2WAY  5CC 16FR (CATHETERS) ×1
CATH FOLEY SIL 2WAY 14FR5CC (CATHETERS) IMPLANT
CATH URETL 5X70 OPEN END (CATHETERS) ×5 IMPLANT
CATH URTH 16FR FL 2W BLN LF (CATHETERS) ×3 IMPLANT
CHLORAPREP W/TINT 26 (MISCELLANEOUS) ×4 IMPLANT
CORD BIP STRL DISP 12FT (MISCELLANEOUS) ×3 IMPLANT
CORD MONOPOLAR M/FML 12FT (MISCELLANEOUS) ×3 IMPLANT
COVER TIP SHEARS 8 DVNC (MISCELLANEOUS) ×3 IMPLANT
COVER TIP SHEARS 8MM DA VINCI (MISCELLANEOUS) ×2
COVER WAND RF STERILE (DRAPES) ×4 IMPLANT
DEFOGGER SCOPE WARMER CLEARIFY (MISCELLANEOUS) ×4 IMPLANT
DERMABOND ADVANCED (GAUZE/BANDAGES/DRESSINGS) ×1
DERMABOND ADVANCED .7 DNX12 (GAUZE/BANDAGES/DRESSINGS) ×3 IMPLANT
DRAPE 3/4 80X56 (DRAPES) ×6 IMPLANT
DRAPE ARM DVNC X/XI (DISPOSABLE) ×9 IMPLANT
DRAPE COLUMN DVNC XI (DISPOSABLE) ×3 IMPLANT
DRAPE DA VINCI XI ARM (DISPOSABLE) ×4
DRAPE DA VINCI XI COLUMN (DISPOSABLE) ×1
DRAPE LEGGINS SURG 28X43 STRL (DRAPES) ×3 IMPLANT
DRESSING SURGICEL FIBRLLR 1X2 (HEMOSTASIS) ×3 IMPLANT
DRSG SURGICEL FIBRILLAR 1X2 (HEMOSTASIS) ×4
ELECT REM PT RETURN 9FT ADLT (ELECTROSURGICAL) ×4
ELECTRODE REM PT RTRN 9FT ADLT (ELECTROSURGICAL) ×3 IMPLANT
GLOVE BIO SURGEON STRL SZ 6.5 (GLOVE) ×31 IMPLANT
GLOVE INDICATOR 7.0 STRL GRN (GLOVE) ×16 IMPLANT
GOWN STRL REUS W/ TWL LRG LVL3 (GOWN DISPOSABLE) ×21 IMPLANT
GOWN STRL REUS W/TWL LRG LVL3 (GOWN DISPOSABLE) ×12
GRASPER SUT TROCAR 14GX15 (MISCELLANEOUS) ×4 IMPLANT
GUIDEWIRE STR DUAL SENSOR (WIRE) ×5 IMPLANT
HANDLE YANKAUER SUCT BULB TIP (MISCELLANEOUS) ×1 IMPLANT
IRRIGATION STRYKERFLOW (MISCELLANEOUS) IMPLANT
IRRIGATOR STRYKERFLOW (MISCELLANEOUS) ×4
IV NS 1000ML (IV SOLUTION) ×1
IV NS 1000ML BAXH (IV SOLUTION) IMPLANT
KIT IMAGING PINPOINTPAQ (MISCELLANEOUS) ×2 IMPLANT
KIT PINK PAD W/HEAD ARE REST (MISCELLANEOUS) ×4
KIT PINK PAD W/HEAD ARM REST (MISCELLANEOUS) ×3 IMPLANT
KIT TURNOVER CYSTO (KITS) ×8 IMPLANT
LABEL OR SOLS (LABEL) ×4 IMPLANT
MANIPULATOR VCARE LG CRV RETR (MISCELLANEOUS) ×1 IMPLANT
MANIPULATOR VCARE SML CRV RETR (MISCELLANEOUS) IMPLANT
MANIPULATOR VCARE STD CRV RETR (MISCELLANEOUS) IMPLANT
NDL INSUFF ACCESS 14 VERSASTEP (NEEDLE) ×4 IMPLANT
NDL SPNL 22GX3.5 QUINCKE BK (NEEDLE) ×3 IMPLANT
NDL SPNL 22GX5 LNG QUINC BK (NEEDLE) ×3 IMPLANT
NEEDLE HYPO 22GX1.5 SAFETY (NEEDLE) ×4 IMPLANT
NEEDLE SPNL 22GX3.5 QUINCKE BK (NEEDLE) IMPLANT
NEEDLE SPNL 22GX5 LNG QUINC BK (NEEDLE) IMPLANT
NS IRRIG 1000ML POUR BTL (IV SOLUTION) ×3 IMPLANT
NS IRRIG 500ML POUR BTL (IV SOLUTION) ×4 IMPLANT
OBTURATOR OPTICAL STANDARD 8MM (TROCAR) ×1
OBTURATOR OPTICAL STND 8 DVNC (TROCAR) ×3
OBTURATOR OPTICALSTD 8 DVNC (TROCAR) ×3 IMPLANT
OCCLUDER COLPOPNEUMO (BALLOONS) ×4 IMPLANT
PACK CYSTO AR (MISCELLANEOUS) ×4 IMPLANT
PACK GYN LAPAROSCOPIC (MISCELLANEOUS) ×4 IMPLANT
PAD OB MATERNITY 4.3X12.25 (PERSONAL CARE ITEMS) ×4 IMPLANT
PAD PREP 24X41 OB/GYN DISP (PERSONAL CARE ITEMS) ×4 IMPLANT
PORT ACCESS TROCAR AIRSEAL 5 (TROCAR) ×4 IMPLANT
SEAL CANN 8.5 DVNC (CANNULA) ×9 IMPLANT
SET CYSTO W/LG BORE CLAMP LF (SET/KITS/TRAYS/PACK) ×4 IMPLANT
SET TRI-LUMEN FLTR TB AIRSEAL (TUBING) ×5 IMPLANT
SIDE-ARM FITTING (MISCELLANEOUS) IMPLANT
SLEEVE VERSASTEP EXPAND ONEST (MISCELLANEOUS) ×4 IMPLANT
SOL .9 NS 3000ML IRR  AL (IV SOLUTION) ×1
SOL .9 NS 3000ML IRR UROMATIC (IV SOLUTION) ×3 IMPLANT
SOLUTION ELECTROLUBE (MISCELLANEOUS) ×4 IMPLANT
STENT URET 6FRX24 CONTOUR (STENTS) ×1 IMPLANT
STENT URET 6FRX26 CONTOUR (STENTS) IMPLANT
SURGILUBE 2OZ TUBE FLIPTOP (MISCELLANEOUS) ×4 IMPLANT
SUT DVC VLOC 180 0 12IN GS21 (SUTURE) ×4
SUT MNCRL 4-0 (SUTURE) ×1
SUT MNCRL 4-0 27XMFL (SUTURE) ×3
SUT SILK 0 (SUTURE)
SUT SILK 0 30XBRD TIE 6 (SUTURE) IMPLANT
SUT VIC AB 1 CT1 36 (SUTURE) ×7 IMPLANT
SUT VICRYL 0 AB UR-6 (SUTURE) ×6 IMPLANT
SUTURE DVC VLC 180 0 12IN GS21 (SUTURE) ×3 IMPLANT
SUTURE MNCRL 4-0 27XMF (SUTURE) IMPLANT
SYR 3ML LL SCALE MARK (SYRINGE) ×6 IMPLANT
SYR 50ML LL SCALE MARK (SYRINGE) ×6 IMPLANT
SYRINGE IRR TOOMEY STRL 70CC (SYRINGE) ×3 IMPLANT
TROCAR BALLN GELPORT 12X130M (ENDOMECHANICALS) ×3 IMPLANT
TROCAR SL VERSASTEP 5M LG  B (MISCELLANEOUS) ×1
TROCAR SL VERSASTEP 5M LG B (MISCELLANEOUS) ×3 IMPLANT
TUBING ART PRESS 48 MALE/FEM (TUBING) IMPLANT
WATER STERILE IRR 1000ML POUR (IV SOLUTION) ×4 IMPLANT

## 2019-07-15 NOTE — Op Note (Addendum)
Date of procedure: 07/15/19  Preoperative diagnosis:  1. Uterine fibroid  Postoperative diagnosis:  1. Same as above  Procedure: 1. Cystoscopy 2. Ureteral injection of ICG for the purpose of ureteral identification 3. Left retrograde pyelogram with radiologic interpretation less than 30 minutes 4. Left ureteral stent placement  Surgeon: Hollice Espy, MD  Anesthesia: General  Complications: None  Intraoperative findings: Unremarkable cystoscopy.  EBL: Minimal  Specimens: None  Drains: None  Indication: Cheryl Schmidt is a 49 y.o. patient with large uterine fibroid and menorrhagia undergoing robotic hysterectomy.  I been asked by the primary surgeon to assist with ureteral identification.  After reviewing the management options for treatment, she elected to proceed with the above surgical procedure(s). We have discussed the potential benefits and risks of the procedure, side effects of the proposed treatment, the likelihood of the patient achieving the goals of the procedure, and any potential problems that might occur during the procedure or recuperation. Informed consent has been obtained.  Description of procedure:  The patient was taken to the operating room and general anesthesia was induced.  The patient was placed in the dorsal lithotomy position, prepped and draped in the usual sterile fashion, and preoperative antibiotics were administered. A preoperative time-out was performed.   A 21 French scope was advanced per urethra into the bladder.  The bladder was inspected and noted to be normal.  Attention was turned first to the right ureteral orifice.  It was intubated using a 5 [place] open-ended ureteral catheter proximal and 1 cm within the UO.  Gentle retrograde injection was performed with total of 10 cc of 25 mg of ICG.  The same exact procedure was performed on the left.  This was uncomplicated.  There was a flecks of green material from each ureteral orifice following  injection.  The bladder was then drained and the scope was removed.  The remainder of the procedure was performed by Dr. Nechama Guard.  Please see her dictation for details.  Addendum: I was called back into the operating room near the end of the procedure after the uterus had been removed.  There is concerned about possible left ureteral obstruction and/or injury.  Initially, the ICG material was difficult to visualize but with some manipulation including manipulation of the bladder, could be easily seen which was unremarkable on the patient's right but on the left, was relatively close to the dissection bed and abruptly ended at the level of the uterine artery stump where there was a fair amount of cautery artifact.  There was concern for possible ureteral thermal injury at this level although the ureter itself appeared to be intact.  At this point in time, I elected to perform a retrograde pyelogram.  The robot was undocked and the patient was closed.  A C-arm was brought in and cystoscopy was performed.  There was in fact clear reflux of urine from the left ureter which is reassuring.  This was an intubated using a 5 [place] open-ended ureteral catheter and gentle retrograde pyelogram was performed on this side.  This showed no extravasation caliber change or tortuosity.  The ureter itself appeared to be intact.  Due to the close proximity of cautery near the level of the uterine artery, did elect to go ahead and place a stent as a precaution.  A wire was then placed up to the level of the kidney under fluoroscopic guidance.  A 6 x 24 French double-J ureteral stent was then advanced over the wire up to  the level of the kidney.  The wire was partially drawn until coil was noted to go over the lower pole as well as a full coil within the bladder.  The bladder was then drained.  Patient was then cleaned and dried, repositioned in supine position reversed myesthesia taken to PACU in stable condition.  Plan: We will  have her return to the office in about 3 weeks for cystoscopy, stent removal.  She was prescribed medications to help with any stent discomfort.  Hollice Espy, M.D.

## 2019-07-15 NOTE — Anesthesia Preprocedure Evaluation (Signed)
Anesthesia Evaluation  Patient identified by MRN, date of birth, ID band Patient awake    Reviewed: Allergy & Precautions, NPO status , Patient's Chart, lab work & pertinent test results  History of Anesthesia Complications Negative for: history of anesthetic complications  Airway Mallampati: II       Dental   Pulmonary neg sleep apnea, neg COPD,           Cardiovascular (-) hypertension(-) Past MI and (-) CHF (-) dysrhythmias (-) Valvular Problems/Murmurs     Neuro/Psych neg Seizures    GI/Hepatic Neg liver ROS, neg GERD  ,  Endo/Other  neg diabetes  Renal/GU negative Renal ROS     Musculoskeletal   Abdominal   Peds  Hematology   Anesthesia Other Findings   Reproductive/Obstetrics                             Anesthesia Physical Anesthesia Plan  ASA: I  Anesthesia Plan: General   Post-op Pain Management:    Induction: Intravenous  PONV Risk Score and Plan: 3 and Ondansetron, Dexamethasone and Midazolam  Airway Management Planned: Oral ETT  Additional Equipment:   Intra-op Plan:   Post-operative Plan:   Informed Consent: I have reviewed the patients History and Physical, chart, labs and discussed the procedure including the risks, benefits and alternatives for the proposed anesthesia with the patient or authorized representative who has indicated his/her understanding and acceptance.       Plan Discussed with:   Anesthesia Plan Comments:         Anesthesia Quick Evaluation

## 2019-07-15 NOTE — Discharge Instructions (Signed)
You have a ureteral stent in place.  This is a tube that extends from your kidney to your bladder.  This may cause urinary bleeding, burning with urination, and urinary frequency.  Please call our office or present to the ED if you develop fevers >101 or pain which is not able to be controlled with oral pain medications.  You may be given either Flomax and/ or ditropan to help with bladder spasms and stent pain in addition to pain medications.    [facility] [address] [place],  [id number] [phone]    Robotic Assisted Vaginal Hysterectomy, Care After This sheet gives you information about how to care for yourself after your procedure. Your health care provider may also give you more specific instructions. If you have problems or questions, contact your health care provider. What can I expect after the procedure? After the procedure, it is common to have:  Soreness and numbness in your incision areas.  Abdominal pain. You will be given pain medicine to control it.  Vaginal bleeding and discharge. You will need to use a sanitary napkin after this procedure.  Sore throat from the breathing tube that was inserted during surgery. Follow these instructions at home: Medicines  Take over-the-counter and prescription medicines only as told by your health care provider.  Do not take aspirin or ibuprofen. These medicines can cause bleeding.  Do not drive or use heavy machinery while taking prescription pain medicine.  Do not drive for 24 hours if you were given a medicine to help you relax (sedative) during the procedure. Incision care   Follow instructions from your health care provider about how to take care of your incisions. Make sure you: ? Wash your hands with soap and water before you change your bandage (dressing). If soap and water are not available, use hand sanitizer. ? Change your dressing as told by your health care  provider. ? Leave stitches (sutures), skin glue, or adhesive strips in place. These skin closures may need to stay in place for 2 weeks or longer. If adhesive strip edges start to loosen and curl up, you may trim the loose edges. Do not remove adhesive strips completely unless your health care provider tells you to do that.  Check your incision area every day for signs of infection. Check for: ? Redness, swelling, or pain. ? Fluid or blood. ? Warmth. ? Pus or a bad smell. Activity  Get regular exercise as told by your health care provider. You may be told to take short walks every day and go farther each time.  Return to your normal activities as told by your health care provider. Ask your health care provider what activities are safe for you.  Do not douche, use tampons, or have sexual intercourse for at least 12 weeks, or until your health care provider gives you permission.  Do not lift anything that is heavier than 10 lb (4.5 kg), or the limit that your health care provider tells you, until he or she says that it is safe. General instructions  Do not take baths, swim, or use a hot tub until your health care provider approves. Take showers instead of baths.  Do not drive for 24 hours if you received a sedative.  Do not drive or operate heavy machinery while taking prescription pain medicine.  To prevent or treat constipation while you are taking prescription pain medicine, your health care provider may recommend that you: ? Drink enough fluid  to keep your urine clear or pale yellow. ? Take over-the-counter or prescription medicines. ? Eat foods that are high in fiber, such as fresh fruits and vegetables, whole grains, and beans. ? Limit foods that are high in fat and processed sugars, such as fried and sweet foods.  Keep all follow-up visits as told by your health care provider. This is important. Contact a health care provider if:  You have signs of infection, such  as: ? Redness, swelling, or pain around your incision sites. ? Fluid or blood coming from an incision. ? An incision that feels warm to the touch. ? Pus or a bad smell coming from an incision.  Your incision breaks open.  Your pain medicine is not helping.  You feel dizzy or light-headed.  You have pain or bleeding when you urinate.  You have persistent nausea and vomiting.  You have blood, pus, or a bad-smelling discharge from your vagina. Get help right away if:  You have a fever.  You have severe abdominal pain.  You have chest pain.  You have shortness of breath.  You faint.  You have pain, swelling, or redness in your leg.  You have heavy bleeding from your vagina. Summary  After the procedure, it is common to have abdominal pain and vaginal bleeding.  You should not drive or lift heavy objects until your health care provider says that it is safe.  Contact your health care provider if you have any symptoms of infection, excessive vaginal bleeding, nausea, vomiting, or shortness of breath. This information is not intended to replace advice given to you by your health care provider. Make sure you discuss any questions you have with your health care provider. Document Revised: 01/25/2017 Document Reviewed: 04/10/2016 Elsevier Patient Education  2020 [employer].

## 2019-07-15 NOTE — Anesthesia Procedure Notes (Signed)
Procedure Name: Intubation Date/Time: 07/15/2019 8:40 AM Performed by: Hedda Slade, CRNA Pre-anesthesia Checklist: Patient identified, Patient being monitored, Timeout performed, Emergency Drugs available and Suction available Patient Re-evaluated:Patient Re-evaluated prior to induction Oxygen Delivery Method: Circle system utilized Preoxygenation: Pre-oxygenation with 100% oxygen Induction Type: IV induction Ventilation: Mask ventilation without difficulty Laryngoscope Size: 3 and McGraph Grade View: Grade I Tube type: Oral Tube size: 7.0 mm Number of attempts: 1 Airway Equipment and Method: Stylet and Video-laryngoscopy Placement Confirmation: ETT inserted through vocal cords under direct vision,  positive ETCO2 and breath sounds checked- equal and bilateral Secured at: 21 cm Tube secured with: Tape Dental Injury: Teeth and Oropharynx as per pre-operative assessment

## 2019-07-15 NOTE — Op Note (Signed)
Operative Note   Date 07/15/2019 Time 12:41  PRE-OP DIAGNOSIS: Enlarged leiomyomatous uterus with symptomatic menorrhagia, desires definitive management.    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   SURGEON: Surgeon(s) and Role:    * Sheilia Reznick Gaetana Michaelis, MD - Primary Surgeon  ASSISTANT:  Adrian Prows, MD - Assistant Surgeon  ANESTHESIA: General   PROCEDURE: Procedure(s): Exam under anesthesia, robotic assisted total hysterectomy with contained manual morcellation within in laparoscopic bag, and bilateral salpingectomy.   Cystoscopy and indocyanine green instillation with repeat cystoscopy and left ureteral stent placement.  ESTIMATED BLOOD LOSS: ~ 50 cc  DRAINS: Foley  TOTAL IV FLUIDS: 1000 cc IVG UOP: 300 cc  SPECIMENS:  Uterus, cervix, and bilateral tubes  COMPLICATIONS: None  DISPOSITION: Stable to Postoperative recovery  CONDITION: Stable  INDICATIONS:  Symptomatic leiomyomatous uterus  FINDINGS: Exam under anesthesia revealed an 18-week mobile enlarged uterus with anterior leiomyoma and enlarged cervix and lower uterine segment. . There were no adnexal masses or nodularit but exam limited by uterine size. The parametria was smooth. The cervix was negative for gross lesions, os dilated. Intraoperative findings included: The uterus was enlarged to 18 cm with a large at least with a 12-14 cm lower uterine segment/cervical leiomyoma. The leiomyoma had both anterior and posterior involvement right greater than left and unable to visualize the anterior aspect of th superior part of the vaginal manipulator cup due to the fibroid location. On the left the ureter was very close to the lateral aspect of the leiomyoma at the junction where the uterine artery crosses anterior to the ureter.  The adnexa were normal bilaterally. The upper abdomen  was normal including omentum, bowel, liver, stomach, and diaphragmatic surfaces. There was no evidence of grossly malignant pelviclymph nodes.    PROCEDURE IN DETAIL: After informed consent was obtained, the patient was taken to the operating room where anesthesia was obtained without difficulty. Please refer to Dr. Cherrie Gauze note for details of her procedure. The patient was positioned in the dorsal lithotomy position in [place] and her arms were carefully tucked at her sides and the usual precautions were taken.  She was prepped and draped in normal sterile fashion.  Time-out was performed and a Foley catheter was placed into the bladder. Due to the cervical and lower uterine segment leiomyoma we awaited until abdominal entry to place the  [facility] uterine manipulator under direct visualization.   Operative entry was obtained via a supraumbilical incision and direct entry using the 5 mm AirSeal port. The abdomen insuffulated, and pelvis visualized with noted findings above. The Vcare was placed at this point. Due to uterine size and leiomyoma location the cup of the manipulator could not be seen. The robotic ports and LUQ port placement was performed. The patient was placed in Trendelenburg and the bowel was displaced up into the upper abdomen. Robotic docking was performed and robotic monopolar shears and fenestrated bipolar forceps placed.   The mesosalpinx was transected bilaterally. Round ligaments were divided on each side with the EndoShears and the retroperitoneal space was opened bilaterally.  The ureters were identified transperitoneally. The fluorescent dye was not visualized but the ureters were easily seen. The utero-ovarian ligaments were skeletonized, sealed and divided with care to avoid the ureter.  A bladder flap was created and the bladder was dissected down off the lower uterine segment and cervix using electrocautery.  The uterine arteries/veins were skeletonized bilaterally and  sealed above the level of the lower uterine/cervical fibroid. The uterine vessels were cauterized and transected with care and dissected laterally away from the enlarged fibroid. Due to the location of the fibroid this was continued below the external cervical os. The fibroid was manipulated superiorly until the lower cup of uterine manipulator could be seen in the vaginal. On the left the ureter was noted to be very close to the lateral border of the fibroid and coursing below the uterine artery. Care was used in the area to avoid the ureter and stay medial with the dissection. Posteriorly the peritoneum overlying the fibroid and the uterosacral ligaments were transected allowing better visualization. Further dissection was performed until a colpotomy could be made circumferentially. The cervix was incised from the vagina and the specimen. The manipulator was removed. A large laparoscopic bag was placed through the vagina into the peritoneal cavity. The specimen was placed in the bag and was removed through the vagina by manual morcellation within the bag to avoid spillage.  A pneumo balloon was placed in the vagina and the vaginal cuff was then closed in a running continuous fashion using the Robotic needle drive and 0 V-Lock suture with careful attention to include the vaginal cuff angles and the vaginal mucosa within the closure.   Hemostasis was observed. The intraperitoneal pressure was dropped, and all planes of dissection, vascular pedicles and the vaginal cuff were found to be hemostatic.  Dr. Erlene Quan re-evaluated the ureter - please see her operative note. There was no evidence of ureteral obstruction. Due to the close proximity to the surgical margin and use of cautery a prophylactic stent was placed in the left ureter. Fibrillar was placed on raw surfaces.   The umbilical trocar was removed and the fascia was closed with 0 Vicryl suture using the Endoclose technique. The lateral trocars were removed  under visualization.   Before the LUQ trocar was removed the CO2 gas was released.   The skin incisions were closed with subcuticular stitch.  The remaining skin incisions were closed with Indermil glue.  The patient tolerated the procedure well.  Sponge, lap and needle counts were correct x2.  The patient was taken to recovery room in excellent condition.  Antibiotics: Given 1st or 2nd generation cephalosporin, Antibiotics  given prior to the start of the procedure and discontinued after surgery  VTE prophylaxis: was ordered perioperatively. SCDs were placed.   Surgery required a high-level surgical assistant with none other readily available. Dr. Gilman Schmidt assisted me with the entire procedure.      Gillis Ends, MD

## 2019-07-15 NOTE — Interval H&P Note (Signed)
History and Physical Interval Note:  07/15/2019 7:59 AM  Cheryl Schmidt  has presented today for surgery, with the diagnosis of Fibroid uterus D25.9 Excessive and frequent menstruation with regular cycle N92.0.  The various methods of treatment have been discussed with the patient and family. After consideration of risks, benefits and other options for treatment, the patient has consented to  Procedure(s): INDOCYANINE GREEN FLUORESCENCE IMAGING (ICG) (Bilateral) XI ROBOTIC ASSISTED TOTAL HYSTERECTOMY WITH BILATERAL SALPINGECTOMY (Bilateral) as a surgical intervention.  The patient's history has been reviewed, patient examined, no change in status, stable for surgery.  I have reviewed the patient's chart and labs.  Questions were answered to the patient's satisfaction.     [place]

## 2019-07-15 NOTE — Transfer of Care (Signed)
Immediate Anesthesia Transfer of Care Note  Patient: Cheryl Schmidt  Procedure(s) Performed: INDOCYANINE GREEN FLUORESCENCE IMAGING (ICG) (Bilateral Ureter) CYSTOSCOPY Ureteral injection of ICG for the purpose of ureteral identification  (Bilateral Ureter) CYSTOSCOPY WITH RETROGRADE PYELOGRAM/URETERAL STENT PLACEMENT (Left Ureter) XI ROBOTIC ASSISTED TOTAL HYSTERECTOMY WITH BILATERAL SALPINGECTOMY (Bilateral Abdomen)  Patient Location: PACU  Anesthesia Type:General  Level of Consciousness: sedated  Airway & Oxygen Therapy: Patient Spontanous Breathing and Patient connected to face mask oxygen  Post-op Assessment: Report given to RN and Post -op Vital signs reviewed and stable  Post vital signs: Reviewed and stable  Last Vitals:  Vitals Value Taken Time  BP 130/87 07/15/19 1226  Temp 36.6 C 07/15/19 1226  Pulse 100 07/15/19 1227  Resp 19 07/15/19 1227  SpO2 100 % 07/15/19 1227  Vitals shown include unvalidated device data.  Last Pain:  Vitals:   07/15/19 0719  TempSrc: Tympanic  PainSc: 0-No pain         Complications: No apparent anesthesia complications

## 2019-07-15 NOTE — Interval H&P Note (Signed)
History and Physical Interval Note:  07/15/2019 8:22 AM  Cheryl Schmidt  has presented today for surgery, with the diagnosis of Fibroid uterus D25.9 Excessive and frequent menstruation with regular cycle N92.0.  The various methods of treatment have been discussed with the patient and family. After consideration of risks, benefits and other options for treatment, the patient has consented to  Procedure(s): INDOCYANINE GREEN FLUORESCENCE IMAGING (ICG) (Bilateral) XI ROBOTIC ASSISTED TOTAL HYSTERECTOMY WITH BILATERAL SALPINGECTOMY (Bilateral) as a surgical intervention.  The patient's history has been reviewed, patient examined, no change in status, stable for surgery.  I have reviewed the patient's chart and labs.  Questions were answered to the patient's satisfaction.    RRR CTAB  Plan for ICG injection for purpose of ureteral identification.  Risks including hematuria, UTI, etc discussed.   Hollice Espy

## 2019-07-16 DIAGNOSIS — N920 Excessive and frequent menstruation with regular cycle: Secondary | ICD-10-CM | POA: Diagnosis present

## 2019-07-16 DIAGNOSIS — D62 Acute posthemorrhagic anemia: Secondary | ICD-10-CM | POA: Diagnosis not present

## 2019-07-16 DIAGNOSIS — R309 Painful micturition, unspecified: Secondary | ICD-10-CM | POA: Diagnosis not present

## 2019-07-16 DIAGNOSIS — D259 Leiomyoma of uterus, unspecified: Secondary | ICD-10-CM

## 2019-07-16 DIAGNOSIS — Z85820 Personal history of malignant melanoma of skin: Secondary | ICD-10-CM | POA: Diagnosis not present

## 2019-07-16 DIAGNOSIS — Z20822 Contact with and (suspected) exposure to covid-19: Secondary | ICD-10-CM | POA: Diagnosis present

## 2019-07-16 DIAGNOSIS — N939 Abnormal uterine and vaginal bleeding, unspecified: Secondary | ICD-10-CM

## 2019-07-16 LAB — CBC
HCT: 32.8 % — ABNORMAL LOW (ref 36.0–46.0)
Hemoglobin: 10.9 g/dL — ABNORMAL LOW (ref 12.0–15.0)
MCH: 27.2 pg (ref 26.0–34.0)
MCHC: 33.2 g/dL (ref 30.0–36.0)
MCV: 81.8 fL (ref 80.0–100.0)
Platelets: 312 10*3/uL (ref 150–400)
RBC: 4.01 MIL/uL (ref 3.87–5.11)
RDW: 15.6 % — ABNORMAL HIGH (ref 11.5–15.5)
WBC: 14.2 10*3/uL — ABNORMAL HIGH (ref 4.0–10.5)
nRBC: 0 % (ref 0.0–0.2)

## 2019-07-16 MED ORDER — METOCLOPRAMIDE HCL 5 MG/ML IJ SOLN
10.0000 mg | Freq: Three times a day (TID) | INTRAMUSCULAR | Status: DC
  Administered 2019-07-16 – 2019-07-17 (×4): 10 mg via INTRAVENOUS
  Filled 2019-07-16 (×4): qty 2

## 2019-07-16 MED ORDER — SCOPOLAMINE 1 MG/3DAYS TD PT72
1.0000 | MEDICATED_PATCH | TRANSDERMAL | Status: DC
  Administered 2019-07-16: 1.5 mg via TRANSDERMAL
  Filled 2019-07-16: qty 1

## 2019-07-16 MED ORDER — OXYBUTYNIN CHLORIDE 5 MG PO TABS
5.0000 mg | ORAL_TABLET | Freq: Three times a day (TID) | ORAL | Status: DC
  Administered 2019-07-16 – 2019-07-17 (×4): 5 mg via ORAL
  Filled 2019-07-16 (×6): qty 1

## 2019-07-16 MED ORDER — ONDANSETRON HCL 4 MG PO TABS: 4.0000 mg | ORAL_TABLET | Freq: Four times a day (QID) | ORAL | Status: DC | PRN

## 2019-07-16 MED ORDER — TAMSULOSIN HCL 0.4 MG PO CAPS
0.4000 mg | ORAL_CAPSULE | Freq: Every day | ORAL | Status: DC
  Administered 2019-07-16 – 2019-07-17 (×2): 0.4 mg via ORAL
  Filled 2019-07-16 (×2): qty 1

## 2019-07-16 MED ORDER — SODIUM CHLORIDE 0.9 % IV SOLN
8.0000 mg | Freq: Four times a day (QID) | INTRAVENOUS | Status: DC | PRN
  Filled 2019-07-16: qty 4

## 2019-07-16 MED ORDER — KETOROLAC TROMETHAMINE 30 MG/ML IJ SOLN
30.0000 mg | Freq: Three times a day (TID) | INTRAMUSCULAR | Status: DC
  Administered 2019-07-16 – 2019-07-17 (×4): 30 mg via INTRAVENOUS
  Filled 2019-07-16 (×4): qty 1

## 2019-07-16 NOTE — Anesthesia Postprocedure Evaluation (Signed)
Anesthesia Post Note  Patient: Cheryl Schmidt  Procedure(s) Performed: INDOCYANINE GREEN FLUORESCENCE IMAGING (ICG) (Bilateral Ureter) CYSTOSCOPY Ureteral injection of ICG for the purpose of ureteral identification  (Bilateral Ureter) CYSTOSCOPY WITH RETROGRADE PYELOGRAM/URETERAL STENT PLACEMENT (Left Ureter) XI ROBOTIC ASSISTED TOTAL HYSTERECTOMY WITH BILATERAL SALPINGECTOMY (Bilateral Abdomen)  Patient location during evaluation: PACU Anesthesia Type: General Level of consciousness: awake and alert Pain management: pain level controlled Vital Signs Assessment: post-procedure vital signs reviewed and stable Respiratory status: spontaneous breathing, nonlabored ventilation and respiratory function stable Cardiovascular status: blood pressure returned to baseline and stable Postop Assessment: no apparent nausea or vomiting Anesthetic complications: no     Last Vitals:  Vitals:   07/16/19 0306 07/16/19 0813  BP: 105/72 112/68  Pulse: 77 71  Resp: 18 18  Temp: 36.8 C 36.6 C  SpO2: 99% 98%    Last Pain:  Vitals:   07/16/19 0745  TempSrc:   PainSc: [place]

## 2019-07-16 NOTE — Progress Notes (Signed)
  Subjective:   Patient is feeling well. She reports difficulty with urination. She is sitting for 30 minutes without urinating on the toilet then vomiting and passing urine that way. She reports pain that comes in waves. She has nausea and is not tolerating crackers.   Objective:  Blood pressure 112/68, pulse 71, temperature 97.9 F (36.6 C), resp. rate 18, height 5\' 4"  (1.626 m), weight 72.6 kg, SpO2 98 %.  General: NAD Pulmonary: no increased work of breathing Abdomen: non-distended, non-tender Incisions: clean, dry intact. Extremities: no edema, no erythema, no tenderness  Results for orders placed or performed during the hospital encounter of 07/15/19 (from the past 72 hour(s))  Pregnancy, urine POC     Status: None   Collection Time: 07/15/19  7:21 AM  Result Value Ref Range   Preg Test, Ur NEGATIVE NEGATIVE    Comment:        THE SENSITIVITY OF THIS METHODOLOGY IS >24 mIU/mL   CBC     Status: Abnormal   Collection Time: 07/16/19  5:59 AM  Result Value Ref Range   WBC 14.2 (H) 4.0 - 10.5 K/uL   RBC 4.01 3.87 - 5.11 MIL/uL   Hemoglobin 10.9 (L) 12.0 - 15.0 g/dL   HCT 32.8 (L) 36.0 - 46.0 %   MCV 81.8 80.0 - 100.0 fL   MCH 27.2 26.0 - 34.0 pg   MCHC 33.2 30.0 - 36.0 g/dL   RDW 15.6 (H) 11.5 - 15.5 %   Platelets 312 150 - 400 K/uL   nRBC 0.0 0.0 - 0.2 %    Comment: Performed at [facility], [address] [place], [place] [address]     Assessment:   49 y.o. [phone] postoperativeday # 1   Plan:  1) Acute blood loss anemia - hemodynamically stable and asymptomatic  2) Pain with urination- will start flomax and diamox. Will bladder scan and monitor urine output. May need to place foley.   3) Nausea- will increase zofran and add scopolamine patch.   4) Pain management- will switch to IV Toradol. Continue dilaudid and po pain medications  5) Continue reglan and colace  6) SCDs while in bed  7) Disposition - continue care.   Adrian Prows MD,  [facility], [facility] 07/16/2019 9:14 AM

## 2019-07-17 LAB — SURGICAL PATHOLOGY

## 2019-07-17 MED ORDER — METOCLOPRAMIDE HCL 10 MG PO TABS: 10.0000 mg | ORAL_TABLET | Freq: Three times a day (TID) | ORAL | Status: DC

## 2019-07-17 MED ORDER — OXYCODONE HCL 5 MG PO TABS: 5.0000 mg | ORAL_TABLET | Freq: Four times a day (QID) | ORAL | 0 refills | Status: DC | PRN

## 2019-07-17 MED ORDER — DOCUSATE SODIUM 100 MG PO CAPS: 100.0000 mg | ORAL_CAPSULE | Freq: Two times a day (BID) | ORAL | 0 refills | Status: DC | PRN

## 2019-07-17 MED ORDER — IBUPROFEN 600 MG PO TABS: 600.0000 mg | ORAL_TABLET | Freq: Four times a day (QID) | ORAL | 0 refills | Status: AC | PRN

## 2019-07-17 MED ORDER — IBUPROFEN 600 MG PO TABS: 600.0000 mg | ORAL_TABLET | Freq: Four times a day (QID) | ORAL | Status: DC | PRN

## 2019-07-17 MED ORDER — ACETAMINOPHEN 500 MG PO TABS: 1000.0000 mg | ORAL_TABLET | Freq: Four times a day (QID) | ORAL | 0 refills | Status: AC

## 2019-07-17 MED ORDER — ONDANSETRON HCL 4 MG PO TABS: 4.0000 mg | ORAL_TABLET | Freq: Four times a day (QID) | ORAL | 0 refills | Status: DC | PRN

## 2019-07-17 MED ORDER — POLYETHYLENE GLYCOL 3350 17 G PO PACK: 17.0000 g | PACK | Freq: Every day | ORAL | 0 refills | Status: DC | PRN

## 2019-07-17 MED ORDER — METOCLOPRAMIDE HCL 10 MG PO TABS: 10.0000 mg | ORAL_TABLET | Freq: Three times a day (TID) | ORAL | 0 refills | Status: DC

## 2019-07-17 NOTE — Progress Notes (Signed)
Patient discharged home. Discharge instructions, prescriptions and follow up appointment given to and reviewed with patient. Patient verbalized understanding.

## 2019-07-17 NOTE — Progress Notes (Signed)
  Subjective:   Cheryl Schmidt is up in a chair. She is feeling well. Pain is controlled. She is ambulating in her room. Denies vaginal bleeding. Normal urination. Tolerated dinner of mashed potatoes and chicken. + flatus. Awaiting bowel movement.   Objective:  Blood pressure 114/67, pulse 67, temperature 98.4 F (36.9 C), temperature source Oral, resp. rate 18, height 5\' 4"  (1.626 m), weight 72.6 kg, SpO2 100 %.  General: NAD Pulmonary: no increased work of breathing Abdomen: non-distended, non-tender Incisions: clean, dry, intact Extremities: no edema, no erythema, no tenderness  Results for orders placed or performed during the hospital encounter of 07/15/19 (from the past 72 hour(s))  Pregnancy, urine POC     Status: None   Collection Time: 07/15/19  7:21 AM  Result Value Ref Range   Preg Test, Ur NEGATIVE NEGATIVE    Comment:        THE SENSITIVITY OF THIS METHODOLOGY IS >24 mIU/mL   CBC     Status: Abnormal   Collection Time: 07/16/19  5:59 AM  Result Value Ref Range   WBC 14.2 (H) 4.0 - 10.5 K/uL   RBC 4.01 3.87 - 5.11 MIL/uL   Hemoglobin 10.9 (L) 12.0 - 15.0 g/dL   HCT 32.8 (L) 36.0 - 46.0 %   MCV 81.8 80.0 - 100.0 fL   MCH 27.2 26.0 - 34.0 pg   MCHC 33.2 30.0 - 36.0 g/dL   RDW 15.6 (H) 11.5 - 15.5 %   Platelets 312 150 - 400 K/uL   nRBC 0.0 0.0 - 0.2 %    Comment: Performed at [facility], [address] [place], [place] [address]     Assessment:   49 y.o. [phone] postoperativeday # 2   Plan:   1) Acute blood loss anemia - hemodynamically stable and asymptomatic  2) Pain with urination- improved with flomax and diamox.   3) Nausea- improved with zofran and scopolamine patch.   4) Pain management- switch to exclusive po pain medications  5) Continue reglan and colace. Miralax today  6) SCDs while in bed- patient ambulatory  7) Disposition - anticipate discharge home today  Adrian Prows MD, [facility] 07/17/2019 7:53 AM

## 2019-07-17 NOTE — Discharge Summary (Signed)
Physician Discharge Summary   Patient ID: Cheryl Schmidt [id number] 48 y.o. 02-23-1971  Admit date: 07/15/2019  Discharge date and time: No discharge date for patient encounter.   Admitting Physician: Homero Fellers, MD   Discharge Physician: Adrian Prows MD  Admission Diagnoses: S/P hysterectomy [Z90.710] S/P total hysterectomy [Z90.710]  Discharge Diagnoses: S/p hysterectomy  Admission Condition: good  Discharged Condition: good  Indication for Admission: postoperative recover  Hospital Course: Patient had an uncomplicated postoperative course.  Her pain was controlled with IV and then oral medicine.  She initially had nausea postoperative which resolved with scopolamine patch and IV Zofran.  She is able to tolerate a regular diet and oral medication on postoperative day #2.  She was ambulating normally on postoperative day #2.  She was able to void.  She was passing flatus at the time of her discharge.  Consults: None  Significant Diagnostic Studies: labs: see EPIC  Treatments: IV hydration and analgesia: acetaminophen, Dilaudid and toradol  Discharge Exam: BP 119/77 (BP Location: Left Arm)   Pulse 70   Temp 98.1 F (36.7 C) (Oral)   Resp 18   Ht 5\' 4"  (1.626 m)   Wt 72.6 kg   SpO2 100%   BMI 27.47 kg/m   General Appearance:    Alert, cooperative, no distress, appears stated age  Head:    Normocephalic, without obvious abnormality, atraumatic  Eyes:    PERRL, conjunctiva/corneas clear, EOM's intact, fundi    benign, both eyes  Ears:    Normal TM's and external ear canals, both ears  Nose:   Nares normal, septum midline, mucosa normal, no drainage    or sinus tenderness  Throat:   Lips, mucosa, and tongue normal; teeth and gums normal  Neck:   Supple, symmetrical, trachea midline, no adenopathy;    thyroid:  no enlargement/tenderness/nodules; no carotid   bruit or JVD  Back:     Symmetric, no curvature, ROM normal, no CVA tenderness  Lungs:      Clear to auscultation bilaterally, respirations unlabored  Chest Wall:    No tenderness or deformity   Heart:    Regular rate and rhythm, S1 and S2 normal, no murmur, rub   or gallop  Breast Exam:    No tenderness, masses, or nipple abnormality  Abdomen:     Soft, non-tender, bowel sounds active all four quadrants,    no masses, no organomegaly  Genitalia:    Normal female without lesion, discharge or tenderness  Rectal:    Normal tone, normal prostate, no masses or tenderness;   guaiac negative stool  Extremities:   Extremities normal, atraumatic, no cyanosis or edema  Pulses:   2+ and symmetric all extremities  Skin:   Skin color, texture, turgor normal, no rashes or lesions  Lymph nodes:   Cervical, supraclavicular, and axillary nodes normal  Neurologic:   CNII-XII intact, normal strength, sensation and reflexes    throughout    Disposition: Discharge disposition: 01-Home or Self Care       Patient Instructions:  Allergies as of 07/17/2019      Reactions   Promethazine Other (See Comments)   uncontrollable reflex      Medication List    STOP taking these medications   medroxyPROGESTERone 10 MG tablet Commonly known as: PROVERA     TAKE these medications   acetaminophen 500 MG tablet Commonly known as: TYLENOL Take 2 tablets (1,000 mg total) by mouth every 6 (six) hours.   docusate sodium  100 MG capsule Commonly known as: COLACE Take 1 capsule (100 mg total) by mouth 2 (two) times daily as needed for mild constipation.   ibuprofen 600 MG tablet Commonly known as: ADVIL Take 1 tablet (600 mg total) by mouth every 6 (six) hours as needed for fever or headache. What changed:   medication strength  how much to take  reasons to take this   metoCLOPramide 10 MG tablet Commonly known as: REGLAN Take 1 tablet (10 mg total) by mouth 4 (four) times daily -  before meals and at bedtime.   multivitamin with minerals Tabs tablet Take 1 tablet by mouth daily.    ondansetron 4 MG tablet Commonly known as: ZOFRAN Take 1 tablet (4 mg total) by mouth every 6 (six) hours as needed for nausea.   oxybutynin 5 MG tablet Commonly known as: DITROPAN Take 1 tablet (5 mg total) by mouth every 8 (eight) hours as needed for bladder spasms.   oxyCODONE 5 MG immediate release tablet Commonly known as: Oxy IR/ROXICODONE Take 1-2 tablets (5-10 mg total) by mouth every 6 (six) hours as needed for moderate pain.   polyethylene glycol 17 g packet Commonly known as: MIRALAX / GLYCOLAX Take 17 g by mouth daily as needed for mild constipation.   tamsulosin 0.4 MG Caps capsule Commonly known as: Flomax Take 1 capsule (0.4 mg total) by mouth daily.      Activity: activity as tolerated Diet: regular diet Wound Care: keep wound clean and dry  Follow-up with Dr. Gilman Schmidt in 1 week.  Signed: Homero Fellers 07/17/2019 1:35 PM

## 2019-07-22 ENCOUNTER — Encounter: Payer: Self-pay | Admitting: Obstetrics and Gynecology

## 2019-07-22 ENCOUNTER — Ambulatory Visit (INDEPENDENT_AMBULATORY_CARE_PROVIDER_SITE_OTHER): Payer: BC Managed Care – PPO | Admitting: Obstetrics and Gynecology

## 2019-07-22 ENCOUNTER — Other Ambulatory Visit: Payer: Self-pay

## 2019-07-22 VITALS — BP 112/74 | Ht 64.0 in | Wt 157.6 lb

## 2019-07-22 DIAGNOSIS — Z90710 Acquired absence of both cervix and uterus: Secondary | ICD-10-CM

## 2019-07-22 DIAGNOSIS — K5901 Slow transit constipation: Secondary | ICD-10-CM

## 2019-07-22 MED ORDER — SIMETHICONE 80 MG PO CHEW: 80.0000 mg | CHEWABLE_TABLET | Freq: Four times a day (QID) | ORAL | 0 refills | Status: DC | PRN

## 2019-07-22 MED ORDER — FLEET ENEMA 7-19 GM/118ML RE ENEM: 1.0000 | ENEMA | Freq: Once | RECTAL | 0 refills | Status: AC

## 2019-07-22 NOTE — Progress Notes (Signed)
  Postoperative Follow-up Patient presents post op from robotic hysterectomy with bilateral salpingectomy, cystoscopy with left ureteral stent placement for fibroids, 1 week ago.  Subjective: Patient reports some improvement in her preop symptoms. Eating a regular diet with difficulty. Pain is controlled with current analgesics. Medications being used: acetaminophen and ibuprofen (OTC).  Activity: sedentary. Patient reports additional symptom's since surgery of constipation with small firm stools Monday, Tuesday, and this morning. She has some nausea. She has not had vomiting. She has been taking colace, Miralax, Reglan, and milk of magnesia.   Objective: BP 112/74   Ht 5\' 4"  (1.626 m)   Wt 157 lb 9.6 oz (71.5 kg)   LMP 06/06/2019 (Approximate)   BMI 27.05 kg/m  Physical Exam Constitutional:      Appearance: She is well-developed.  Genitourinary:     Vagina and uterus normal.     No lesions in the vagina.     No cervical motion tenderness.     No right or left adnexal mass present.  HENT:     Head: Normocephalic and atraumatic.  Neck:     Thyroid: No thyromegaly.  Cardiovascular:     Rate and Rhythm: Normal rate and regular rhythm.     Heart sounds: Normal heart sounds.  Pulmonary:     Effort: Pulmonary effort is normal.     Breath sounds: Normal breath sounds.  Chest:     Breasts:        Right: No inverted nipple, mass, nipple discharge or skin change.        Left: No inverted nipple, mass, nipple discharge or skin change.  Abdominal:     General: Abdomen is flat. Bowel sounds are normal. There is no distension.     Palpations: Abdomen is soft. There is no mass.     Tenderness: There is no abdominal tenderness. There is no guarding.     Comments: Incisions are intact, no erythema, no bleeding. No drainage.   Musculoskeletal:     Cervical back: Neck supple.  Neurological:     Mental Status: She is alert and oriented to person, place, and time.  Skin:    General: Skin is  warm and dry.  Psychiatric:        Behavior: Behavior normal.        Thought Content: Thought content normal.        Judgment: Judgment normal.  Vitals reviewed.    Assessment: s/p : robotic hysterectomy with bilateral salpingectomy, cystoscopy with left ureteral stent placement, stable but with constipation  Plan: Patient has done well after surgery. She appears to have constipation today.Marland Kitchen She will attempt a fleet enema at home. Low concern for ileus or bowel obstruction at this time.  I have discussed the post-operative course to date, and the expected progress moving forward.  The patient understands what complications to be concerned about.  I will see the patient in routine follow up, or sooner if needed.    Close follow up next week.  Activity plan: No heavy lifting.  Pelvic rest.  Annlee Glandon R Rossie Bretado 07/22/2019, 10:09 PM

## 2019-07-22 NOTE — Patient Instructions (Addendum)
Sodium Phosphate Monobasic; Sodium Phosphate Dibasic enema What is this medicine? SODIUM PHOSPATE SALT (SOE dee um FOS fate sawlt) is a saline laxative. It is used to treat constipation or to clean the bowel before a colonoscopy. This medicine may be used for other purposes; ask your health care provider or pharmacist if you have questions. COMMON BRAND NAME(S): Fleet, Ready To Use Saline What should I tell my health care provider before I take this medicine? They need to know if you have any of these conditions:  abnormal blood levels of electrolytes like sodium, phosphate, potassium or calcium  bowel problems like colitis, constipation, and obstruction  change in bowel habits lasting more than 2 weeks  chest pain  dehydration  heart failure  kidney disease  on low salt or sodium diet  stomach pain, nausea, vomiting  an unusual or allergic reaction to sodium phosphate, other medicines, foods, dyes, or preservatives  pregnant or trying to get pregnant  breast-feeding How should I use this medicine? This medicine is for rectal use only. Do not take by mouth. Follow the directions on the prescription label. Wash your hands before and after use. Remove tip from enema bottle. Gently insert enema tip into the rectum. Squeeze bottle until almost all of the medicine is inside the rectum. Remove enema tip from the rectum and stay in position until the urge to evacuate is strong. Do not take doses that are larger than those recommended on the product label or otherwise directed by your healthcare professional. Do not take more than one dose in 24 hours. Talk to your pediatrician regarding the use of this medicine in children. While this drug may be prescribed for children as young as 84 years old for selected conditions, precautions do apply. Overdosage: If you think you have taken too much of this medicine contact a poison control center or emergency room at once. NOTE: This medicine is  only for you. Do not share this medicine with others. What if I miss a dose? This does not apply; this medicine is not for regular use. What may interact with this medicine?  aspirin  certain medicines used to treat high blood pressure, like captopril, enalapril, lisinopril, or candesartan, losartan, valsartan  diuretics  NSAIDS, medicines for pain and inflammation, like ibuprofen or naproxen This list may not describe all possible interactions. Give your health care provider a list of all the medicines, herbs, non-prescription drugs, or dietary supplements you use. Also tell them if you smoke, drink alcohol, or use illegal drugs. Some items may interact with your medicine. What should I watch for while using this medicine? Do not use with any other laxatives unless your doctor tells you to. Drink fluids as directed to prevent dehydration. See your doctor right away if you do not have a bowel movement after using this medicine. What side effects may I notice from receiving this medicine? Side effects that you should report to your doctor or health care professional as soon as possible:  allergic reactions like skin rash, itching or hives, swelling of the face, lips, or tongue  irregular heart beat  rectal bleeding  seizures Side effects that usually do not require medical attention (report to your doctor or health care professional if they continue or are bothersome):  bloating  dizziness  headache  nausea and vomiting  stomach pain This list may not describe all possible side effects. Call your doctor for medical advice about side effects. You may report side effects to FDA at  1-800-FDA-1088. Where should I keep my medicine? Keep out of the reach of children. Store at room temperature between 15 and 30 degrees C (59 and 86 degrees F). Throw away any unused medicine after the expiration date. NOTE: This sheet is a summary. It may not cover all possible information. If you have  questions about this medicine, talk to your doctor, pharmacist, or health care provider.  2020 Elsevier/Gold Standard (2012-03-06 14:54:06)   Constipation, Adult Constipation is when a person has fewer bowel movements in a week than normal, has difficulty having a bowel movement, or has stools that are dry, hard, or larger than normal. Constipation may be caused by an underlying condition. It may become worse with age if a person takes certain medicines and does not take in enough fluids. Follow these instructions at home: Eating and drinking   Eat foods that have a lot of fiber, such as fresh fruits and vegetables, whole grains, and beans.  Limit foods that are high in fat, low in fiber, or overly processed, such as french fries, hamburgers, cookies, candies, and soda.  Drink enough fluid to keep your urine clear or pale yellow. General instructions  Exercise regularly or as told by your health care provider.  Go to the restroom when you have the urge to go. Do not hold it in.  Take over-the-counter and prescription medicines only as told by your health care provider. These include any fiber supplements.  Practice pelvic floor retraining exercises, such as deep breathing while relaxing the lower abdomen and pelvic floor relaxation during bowel movements.  Watch your condition for any changes.  Keep all follow-up visits as told by your health care provider. This is important. Contact a health care provider if:  You have pain that gets worse.  You have a fever.  You do not have a bowel movement after 4 days.  You vomit.  You are not hungry.  You lose weight.  You are bleeding from the anus.  You have thin, pencil-like stools. Get help right away if:  You have a fever and your symptoms suddenly get worse.  You leak stool or have blood in your stool.  Your abdomen is bloated.  You have severe pain in your abdomen.  You feel dizzy or you faint. This information is  not intended to replace advice given to you by your health care provider. Make sure you discuss any questions you have with your health care provider. Document Revised: 01/25/2017 Document Reviewed: 08/03/2015 Elsevier Patient Education  2020 [employer].

## 2019-07-29 ENCOUNTER — Ambulatory Visit: Payer: BC Managed Care – PPO | Admitting: Obstetrics and Gynecology

## 2019-07-30 ENCOUNTER — Telehealth: Payer: Self-pay

## 2019-07-30 NOTE — Telephone Encounter (Signed)
Tried to call pt about FMLA/disability. She paid and filled out forms, but there have not been any forms sent to [place]. Please let me know if she still is expecting disability to be done  When she calls back

## 2019-07-31 ENCOUNTER — Ambulatory Visit (INDEPENDENT_AMBULATORY_CARE_PROVIDER_SITE_OTHER): Payer: BC Managed Care – PPO | Admitting: Obstetrics and Gynecology

## 2019-07-31 ENCOUNTER — Other Ambulatory Visit: Payer: Self-pay

## 2019-07-31 ENCOUNTER — Encounter: Payer: Self-pay | Admitting: Obstetrics and Gynecology

## 2019-07-31 VITALS — BP 118/72 | Ht 64.0 in | Wt 155.4 lb

## 2019-07-31 DIAGNOSIS — Z90710 Acquired absence of both cervix and uterus: Secondary | ICD-10-CM

## 2019-07-31 DIAGNOSIS — K5901 Slow transit constipation: Secondary | ICD-10-CM

## 2019-07-31 NOTE — Progress Notes (Signed)
  Postoperative Follow-up Patient presents post op from robotic hysterectomy with bilateral salpingectomy, cystoscopy with left ureteral stent placement for fibroids 2 weeks ago.  Subjective: Patient reports some improvement in her preop symptoms. Eating a regular diet without difficulty. Pain is controlled with current analgesics. Medications being used: acetaminophen and ibuprofen (OTC).  Activity: sedentary. Patient reports additional symptom's since surgery of constipation. Having a bowel movement every 2-3 days now. Taking reglan, colace, and miralax. Moderate pain on her left from the ureteral stent. Taking ditropan and flomax. Reports small pink/brown spotting  Objective: BP 118/72   Ht 5\' 4"  (1.626 m)   Wt 155 lb 6.4 oz (70.5 kg)   LMP 06/06/2019 (Approximate)   BMI 26.67 kg/m  Physical Exam Constitutional:      Appearance: She is well-developed.  Genitourinary:     Vagina and uterus normal.     No lesions in the vagina.     No cervical motion tenderness.     No right or left adnexal mass present.  HENT:     Head: Normocephalic and atraumatic.  Neck:     Thyroid: No thyromegaly.  Cardiovascular:     Rate and Rhythm: Normal rate and regular rhythm.     Heart sounds: Normal heart sounds.  Pulmonary:     Effort: Pulmonary effort is normal.     Breath sounds: Normal breath sounds.  Chest:     Breasts:        Right: No inverted nipple, mass, nipple discharge or skin change.        Left: No inverted nipple, mass, nipple discharge or skin change.  Abdominal:     General: Bowel sounds are normal. There is no distension.     Palpations: Abdomen is soft. There is no mass.  Musculoskeletal:     Cervical back: Neck supple.  Neurological:     Mental Status: She is alert and oriented to person, place, and time.  Skin:    General: Skin is warm and dry.  Psychiatric:        Behavior: Behavior normal.        Thought Content: Thought content normal.        Judgment: Judgment  normal.  Vitals reviewed.     Assessment: s/p :  robotic hysterectomy with bilateral salpingectomy, cystoscopy with left ureteral stent placement for fibroids  stable  Plan: Patient has done well after surgery with no apparent complications.  I have discussed the post-operative course to date, and the expected progress moving forward.  The patient understands what complications to be concerned about.  I will see the patient in routine follow up, or sooner if needed.    Activity plan: No heavy lifting. Pelvic rest.  Niels Cranshaw R Juelz Claar 07/31/2019, 4:29 PM

## 2019-08-02 ENCOUNTER — Encounter: Payer: Self-pay | Admitting: Urology

## 2019-08-03 NOTE — Telephone Encounter (Signed)
Called patient-advised per Dr. Erlene Quan take Flomax and oxybutynin, ensure staying hydrated. Stent removal scheduled for 08/05/19-voiced understanding.

## 2019-08-04 NOTE — Progress Notes (Signed)
Cystoscopy/ Stent removal procedure  She underwent XI robotic assisted total hysterectomy w/ bilateral salpingectomy and cystoscopy w/ retrograde pyelogram/ureteral stent placement on 07/15/19.  Stent primarily places a precaution due to the dissection and cautery. She had a difficult time tolerating the stent  Patient identification was confirmed, informed consent was obtained, and patient was prepped using Betadine solution.  Lidocaine jelly was administered per urethral meatus.    Preoperative abx where received prior to procedure.    Procedure: - Flexible cystoscope introduced, without any difficulty.   - Thorough search of the bladder revealed:    normal urethral meatus  Stent seen emanating from left ureteral orifice, grasped with stent graspers, and removed in entirety.     Post-Procedure: - Patient tolerated the procedure well -Post procedural precautions reviewed -Recommend follow-up renal ultrasound in 1 month, will call with results  I, Lucas Mallow, am acting as a scribe for Dr. Hollice Espy,  I have reviewed the above documentation for accuracy and completeness, and I agree with the above.   Hollice Espy, MD

## 2019-08-05 ENCOUNTER — Ambulatory Visit (INDEPENDENT_AMBULATORY_CARE_PROVIDER_SITE_OTHER): Payer: BC Managed Care – PPO | Admitting: Urology

## 2019-08-05 ENCOUNTER — Encounter: Payer: Self-pay | Admitting: Urology

## 2019-08-05 ENCOUNTER — Other Ambulatory Visit: Payer: Self-pay

## 2019-08-05 VITALS — BP 114/75 | HR 73

## 2019-08-05 DIAGNOSIS — Z466 Encounter for fitting and adjustment of urinary device: Secondary | ICD-10-CM | POA: Diagnosis not present

## 2019-08-05 NOTE — Patient Instructions (Signed)
Will call with results

## 2019-08-06 LAB — URINALYSIS, COMPLETE
Bilirubin, UA: NEGATIVE
Glucose, UA: NEGATIVE
Ketones, UA: NEGATIVE
Nitrite, UA: NEGATIVE
Protein,UA: NEGATIVE
Specific Gravity, UA: 1.015 (ref 1.005–1.030)
Urobilinogen, Ur: 0.2 mg/dL (ref 0.2–1.0)
pH, UA: 5.5 (ref 5.0–7.5)

## 2019-08-06 LAB — MICROSCOPIC EXAMINATION: RBC, Urine: >30 /hpf — AB (ref 0–2)

## 2019-08-08 ENCOUNTER — Other Ambulatory Visit: Payer: Self-pay | Admitting: Urology

## 2019-08-13 ENCOUNTER — Other Ambulatory Visit: Payer: Self-pay | Admitting: Primary Care

## 2019-08-13 DIAGNOSIS — Z1231 Encounter for screening mammogram for malignant neoplasm of breast: Secondary | ICD-10-CM

## 2019-08-18 ENCOUNTER — Other Ambulatory Visit: Payer: Self-pay | Admitting: Urology

## 2019-08-21 ENCOUNTER — Encounter: Payer: Self-pay | Admitting: Obstetrics and Gynecology

## 2019-08-21 ENCOUNTER — Ambulatory Visit (INDEPENDENT_AMBULATORY_CARE_PROVIDER_SITE_OTHER): Payer: BC Managed Care – PPO | Admitting: Obstetrics and Gynecology

## 2019-08-21 ENCOUNTER — Other Ambulatory Visit: Payer: Self-pay

## 2019-08-21 VITALS — BP 110/70 | HR 77 | Resp 16 | Ht 64.0 in | Wt 156.4 lb

## 2019-08-21 DIAGNOSIS — Z90710 Acquired absence of both cervix and uterus: Secondary | ICD-10-CM

## 2019-08-21 NOTE — Progress Notes (Signed)
  Postoperative Follow-up Patient presents post op from robotic hysterectomy with bilateral salpingectomy, cystoscopy with left ureteral stent placement for fibroids, 5 weeks ago.  Subjective: Patient reports some improvement in her preop symptoms. Eating a regular diet without difficulty. Continued pain in left pelvic and lower flank . Activity: sedetary. She must remain reclined to be the most comfortable. Patient reports additional symptom's since surgery of needing to empty her bladder regularly to help with pain. Regularity of bowel movements has greatly improved.  Objective: BP 110/70   Pulse 77   Resp 16   Ht 5\' 4"  (1.626 m)   Wt 156 lb 6.4 oz (70.9 kg)   LMP 06/06/2019 (Approximate)   SpO2 98%   BMI 26.85 kg/m  Physical Exam Constitutional:      Appearance: She is well-developed.  Genitourinary:     Vagina and uterus normal.     No lesions in the vagina.     No cervical motion tenderness.     No right or left adnexal mass present.  HENT:     Head: Normocephalic and atraumatic.  Neck:     Thyroid: No thyromegaly.  Cardiovascular:     Rate and Rhythm: Normal rate and regular rhythm.     Heart sounds: Normal heart sounds.  Pulmonary:     Effort: Pulmonary effort is normal.     Breath sounds: Normal breath sounds.  Chest:     Breasts:        Right: No inverted nipple, mass, nipple discharge or skin change.        Left: No inverted nipple, mass, nipple discharge or skin change.  Abdominal:     General: Abdomen is flat. Bowel sounds are normal. There is no distension.     Palpations: Abdomen is soft. There is no mass.     Comments: Incisions clean, dry intact. Suture knot removed from right incision  Musculoskeletal:     Cervical back: Neck supple.  Neurological:     Mental Status: She is alert and oriented to person, place, and time.  Skin:    General: Skin is warm and dry.  Psychiatric:        Behavior: Behavior normal.        Thought Content: Thought content  normal.        Judgment: Judgment normal.  Vitals reviewed.     Assessment: s/p :  robotic hysterectomy with bilateral salpingectomy, cystoscopy with left ureteral stent placement for fibroids stable  Plan: Patient has done well after surgery with no apparent complications.  I have discussed the post-operative course to date, and the expected progress moving forward.  The patient understands what complications to be concerned about.  I will see the patient in routine follow up, or sooner if needed.    Activity plan: No heavy lifting.  Pelvic rest.  Given work note for return on July 14th 2021 Not able to yet sit upright all day because of pain, mostly reclined  Adrian Prows MD, [place], [place] 08/21/2019 10:43 AM

## 2019-08-24 ENCOUNTER — Ambulatory Visit
Admission: RE | Admit: 2019-08-24 | Discharge: 2019-08-24 | Disposition: A | Discharge status: Home / Self Care | Payer: BC Managed Care – PPO | Source: Ambulatory Visit | Attending: Primary Care | Admitting: Primary Care

## 2019-08-24 DIAGNOSIS — Z1231 Encounter for screening mammogram for malignant neoplasm of breast: Secondary | ICD-10-CM | POA: Insufficient documentation

## 2019-09-04 ENCOUNTER — Ambulatory Visit
Admission: RE | Admit: 2019-09-04 | Discharge: 2019-09-04 | Disposition: A | Discharge status: Home / Self Care | Payer: BC Managed Care – PPO | Source: Ambulatory Visit | Attending: Urology | Admitting: Urology

## 2019-09-04 ENCOUNTER — Other Ambulatory Visit: Payer: Self-pay

## 2019-09-04 DIAGNOSIS — Z960 Presence of urogenital implants: Secondary | ICD-10-CM | POA: Diagnosis not present

## 2019-09-04 DIAGNOSIS — Z466 Encounter for fitting and adjustment of urinary device: Secondary | ICD-10-CM | POA: Diagnosis not present

## 2019-09-04 DIAGNOSIS — N3289 Other specified disorders of bladder: Secondary | ICD-10-CM | POA: Diagnosis not present

## 2019-09-04 DIAGNOSIS — Q600 Renal agenesis, unilateral: Secondary | ICD-10-CM | POA: Diagnosis not present

## 2019-09-07 ENCOUNTER — Telehealth: Payer: Self-pay | Admitting: *Deleted

## 2019-09-07 NOTE — Telephone Encounter (Addendum)
Left patient VM with details, asked to return call with any questions.   ----- Message from Hollice Espy, MD sent at 09/06/2019  3:55 PM EDT ----- Renal ultrasound is perfect.  Hollice Espy, MD

## 2019-09-08 ENCOUNTER — Other Ambulatory Visit: Payer: Self-pay

## 2019-09-08 ENCOUNTER — Ambulatory Visit (INDEPENDENT_AMBULATORY_CARE_PROVIDER_SITE_OTHER): Payer: BC Managed Care – PPO | Admitting: Obstetrics and Gynecology

## 2019-09-08 ENCOUNTER — Encounter: Payer: Self-pay | Admitting: Obstetrics and Gynecology

## 2019-09-08 VITALS — BP 120/72 | HR 66 | Resp 16 | Ht 64.0 in | Wt 157.0 lb

## 2019-09-08 DIAGNOSIS — R350 Frequency of micturition: Secondary | ICD-10-CM

## 2019-09-08 LAB — POCT URINALYSIS DIPSTICK
Bilirubin, UA: NEGATIVE
Blood, UA: NEGATIVE
Glucose, UA: NEGATIVE
Ketones, UA: NEGATIVE
Nitrite, UA: NEGATIVE
Protein, UA: NEGATIVE
Spec Grav, UA: 1.02 (ref 1.010–1.025)
Urobilinogen, UA: 0.2 E.U./dL
pH, UA: 5.5 (ref 5.0–8.0)

## 2019-09-08 MED ORDER — NITROFURANTOIN MONOHYD MACRO 100 MG PO CAPS: 100.0000 mg | ORAL_CAPSULE | Freq: Two times a day (BID) | ORAL | 1 refills | Status: DC

## 2019-09-08 NOTE — Progress Notes (Signed)
Patient ID: Cheryl Schmidt, female   DOB: 1970/06/12, 49 y.o.   MRN: [id number]  Reason for Consult: Post-op Follow-up   Referred by Pleas Koch, NP  Subjective:     HPI:  Cheryl Schmidt is a 49 y.o. female.  She presents today for postoperative follow-up.  She reports that she has continued to have pain associated with her left side and her bladder.  She reports that she urinates every hour.  She reports that any feeling of pressure either with the bladder feeling full or rubbing of her close on her bladder makes her uncomfortable.  She denies any vaginal bleeding.  She reports that she has been having normal bowel movements.    Past Medical History:  Diagnosis Date  . Family history of adverse reaction to anesthesia    mother has N/V  . Fibroids   . Headache   . Melanoma ([place]) 2001   upper abdomen Dr Nehemiah Massed removed  . Menorrhagia with regular cycle   . Ovarian cyst   . Uterine bleeding    Family History  Problem Relation Age of Onset  . Hypertension Father   . Diabetes Father   . Hyperlipidemia Brother   . Hypertension Brother   . Colon cancer Paternal Grandfather 10  . Colon cancer Paternal Uncle 60  . Breast cancer Neg Hx    Past Surgical History:  Procedure Laterality Date  . ABDOMINAL HYSTERECTOMY    . COLONOSCOPY WITH PROPOFOL N/A 10/10/2018   Procedure: COLONOSCOPY WITH PROPOFOL;  Surgeon: Lin Landsman, MD;  Location: [facility];  Service: Gastroenterology;  Laterality: N/A;  . CYSTOSCOPY Bilateral 07/15/2019   Procedure: CYSTOSCOPY Ureteral injection of ICG for the purpose of ureteral identification ;  Surgeon: Hollice Espy, MD;  Location: [facility] ORS;  Service: Urology;  Laterality: Bilateral;  . CYSTOSCOPY W/ URETERAL STENT PLACEMENT Left 07/15/2019   Procedure: CYSTOSCOPY WITH RETROGRADE PYELOGRAM/URETERAL STENT PLACEMENT;  Surgeon: Hollice Espy, MD;  Location: [facility] ORS;  Service: Urology;  Laterality: Left;  . Excision of melanoma  2001  .  INTRAUTERINE DEVICE (IUD) INSERTION  2009  . IUD REMOVAL  2014  . ROBOTIC ASSISTED TOTAL HYSTERECTOMY WITH BILATERAL SALPINGO OOPHERECTOMY Bilateral 07/15/2019   Procedure: XI ROBOTIC ASSISTED TOTAL HYSTERECTOMY WITH BILATERAL SALPINGECTOMY;  Surgeon: Homero Fellers, MD;  Location: [facility] ORS;  Service: Gynecology;  Laterality: Bilateral;  . WISDOM TOOTH EXTRACTION      Short Social History:  Social History   Tobacco Use  . Smoking status: Never Smoker  . Smokeless tobacco: Never Used  Substance Use Topics  . Alcohol use: Yes    Comment: 1 glass of wine/month    Allergies  Allergen Reactions  . Promethazine Other (See Comments)    uncontrollable reflex    Current Outpatient Medications  Medication Sig Dispense Refill  . acetaminophen (TYLENOL) 500 MG tablet Take 2 tablets (1,000 mg total) by mouth every 6 (six) hours. 30 tablet 0  . docusate sodium (COLACE) 100 MG capsule Take 1 capsule (100 mg total) by mouth 2 (two) times daily as needed for mild constipation. 10 capsule 0  . ibuprofen (ADVIL) 600 MG tablet Take 1 tablet (600 mg total) by mouth every 6 (six) hours as needed for fever or headache. 30 tablet 0  . Multiple Vitamin (MULTIVITAMIN WITH MINERALS) TABS tablet Take 1 tablet by mouth daily.     No current facility-administered medications for this visit.    Review of Systems  Constitutional: Negative for chills,  fatigue, fever and unexpected weight change.  HENT: Negative for trouble swallowing.  Eyes: Negative for loss of vision.  Respiratory: Negative for cough, shortness of breath and wheezing.  Cardiovascular: Negative for chest pain, leg swelling, palpitations and syncope.  GI: Negative for abdominal pain, blood in stool, diarrhea, nausea and vomiting.  GU: Negative for difficulty urinating, dysuria, frequency and hematuria.  Musculoskeletal: Negative for back pain, leg pain and joint pain.  Skin: Negative for rash.  Neurological: Negative for dizziness,  headaches, light-headedness, numbness and seizures.  Psychiatric: Negative for behavioral problem, confusion, depressed mood and sleep disturbance.        Objective:  Objective   Vitals:   09/08/19 0852  BP: 120/72  Pulse: 66  Resp: 16  SpO2: 97%  Weight: 157 lb (71.2 kg)  Height: 5\' 4"  (1.626 m)   Body mass index is 26.95 kg/m.  Physical Exam Genitourinary:    Comments: Speculum exam: Suture present at the vaginal cuff.  Bimanual exam: Cuff intact, but still healing.  No bleeding.         Assessment/Plan:     49 year old who is now approximately 8 weeks postop from a robotic hysterectomy bilateral salpingectomy cystoscopy with ureteral stent placement.  Recent follow-up ultrasound for the left kidney and bladder was normal. Will check urinalysis and urine culture today. Will treat with macrobid.  Advised 4 more weeks of pelvic rest.  Patient returning to work soon.  Given note.      Adrian Prows MD [facility], [facility] 09/08/2019 9:37 AM

## 2019-09-10 LAB — URINE CULTURE: Organism ID, Bacteria: NO GROWTH

## 2019-10-08 ENCOUNTER — Other Ambulatory Visit: Payer: Self-pay

## 2019-10-08 ENCOUNTER — Encounter: Payer: Self-pay | Admitting: Obstetrics and Gynecology

## 2019-10-08 ENCOUNTER — Ambulatory Visit (INDEPENDENT_AMBULATORY_CARE_PROVIDER_SITE_OTHER): Payer: BC Managed Care – PPO | Admitting: Obstetrics and Gynecology

## 2019-10-08 VITALS — BP 120/70 | HR 88 | Resp 18 | Ht 64.0 in | Wt 158.6 lb

## 2019-10-08 DIAGNOSIS — Z90710 Acquired absence of both cervix and uterus: Secondary | ICD-10-CM

## 2019-10-08 NOTE — Progress Notes (Signed)
  Postoperative Follow-up Patient presents post op from robotic hysterectomy with bilateral salpingectomy, cystoscopy with left ureteral stent placement for fibroids, 3 months ago.  Subjective: Patient reports marked improvement in her preop symptoms. Eating a regular diet without difficulty. No pain.  Activity: normal activities of daily living. Patient reports additional symptom's since surgery of None.  Objective: BP 120/70   Pulse 88   Resp 18   Ht 5\' 4"  (1.626 m)   Wt 158 lb 9.6 oz (71.9 kg)   LMP 06/06/2019 (Approximate)   SpO2 98%   BMI 27.22 kg/m  Physical Exam Constitutional:      Appearance: She is well-developed.  Genitourinary:     Vagina and uterus normal.     No lesions in the vagina.     No cervical motion tenderness.     No right or left adnexal mass present.     Genitourinary Comments: Well healed vaginal cuff. Small amount of pink granulation tissue at the cuff. No visible suture. No pain on bimanual exam, cuff palpates intact.   HENT:     Head: Normocephalic and atraumatic.  Neck:     Thyroid: No thyromegaly.  Cardiovascular:     Rate and Rhythm: Normal rate and regular rhythm.     Heart sounds: Normal heart sounds.  Pulmonary:     Effort: Pulmonary effort is normal.     Breath sounds: Normal breath sounds.  Chest:     Breasts:        Right: No inverted nipple, mass, nipple discharge or skin change.        Left: No inverted nipple, mass, nipple discharge or skin change.  Abdominal:     General: Bowel sounds are normal. There is no distension.     Palpations: Abdomen is soft. There is no mass.  Musculoskeletal:     Cervical back: Neck supple.  Neurological:     Mental Status: She is alert and oriented to person, place, and time.  Skin:    General: Skin is warm and dry.  Psychiatric:        Behavior: Behavior normal.        Thought Content: Thought content normal.        Judgment: Judgment normal.  Vitals reviewed.     Assessment: s/p :  Robotic hysterectomy with bilateral salpingectomy, cystoscopy with left ureteral stent placement for fibroids, stable  Plan: Patient has done well after surgery with no apparent complications.  I have discussed the post-operative course to date, and the expected progress moving forward.  The patient understands what complications to be concerned about.  I will see the patient in routine follow up, or sooner if needed.    Activity plan: No restriction.  Ein Rijo R Jaquaya Coyle 10/08/2019, 8:14 AM

## 2020-05-20 ENCOUNTER — Ambulatory Visit: Payer: BC Managed Care – PPO | Admitting: Primary Care

## 2020-05-20 ENCOUNTER — Encounter: Payer: Self-pay | Admitting: Primary Care

## 2020-05-20 ENCOUNTER — Other Ambulatory Visit: Payer: Self-pay

## 2020-05-20 VITALS — BP 120/62 | HR 70 | Temp 98.4°F | Ht 64.0 in | Wt 157.0 lb

## 2020-05-20 DIAGNOSIS — R2241 Localized swelling, mass and lump, right lower limb: Secondary | ICD-10-CM

## 2020-05-20 HISTORY — DX: Localized swelling, mass and lump, right lower limb: R22.41

## 2020-05-20 NOTE — Patient Instructions (Signed)
You will be contacted regarding your ultrasound.  Please let us know if you have not been contacted within a few days.   It was a pleasure to see you today!

## 2020-05-20 NOTE — Progress Notes (Signed)
Subjective:    Patient ID: Cheryl Schmidt, female    DOB: 1970-09-19, 50 y.o.   MRN: [id number]  HPI  Cheryl Schmidt is a very pleasant 50 y.o. female who presents today with a chief complaint of skin irritation.  She has not been seen by me since September 2018.  She noticed a "knot" to her right anterior lower extremity, proximal to ankle for which she first noticed 10 days ago. She's noticed tenderness, redness, swelling.    She denies trauma/injury, insect bites, drainage, open wounds, long car rides. She is a non smoker and is not on hormonal therapy. Today she's feeling about the same, but some reduction in swelling and redness.   Review of Systems  Constitutional: Negative for fever.  Cardiovascular: Negative for palpitations.  Skin: Positive for color change. Negative for wound.       See HPI         Past Medical History:  Diagnosis Date  . Family history of adverse reaction to anesthesia    mother has N/V  . Fibroids   . Headache   . Melanoma ([place]) 2001   upper abdomen Dr Nehemiah Massed removed  . Menorrhagia with regular cycle   . Ovarian cyst   . Uterine bleeding     Social History   Socioeconomic History  . Marital status: Married    Spouse name: Gaspar Bidding  . Number of children: 2  . Years of education: Not on file  . Highest education level: Not on file  Occupational History  . Occupation: Medical illustrator  Tobacco Use  . Smoking status: Never Smoker  . Smokeless tobacco: Never Used  Vaping Use  . Vaping Use: Never used  Substance and Sexual Activity  . Alcohol use: Yes    Comment: 1 glass of wine/month  . Drug use: Never  . Sexual activity: Yes    Partners: Male    Birth control/protection: Surgical    Comment: vasectomy  Other Topics Concern  . Not on file  Social History Narrative   Married.   2 children.   Works in Insurance underwriter.   Enjoys spending time with family, walking, exercising.   Social Determinants of Health   Financial Resource Strain:  Not on file  Food Insecurity: Not on file  Transportation Needs: Not on file  Physical Activity: Not on file  Stress: Not on file  Social Connections: Not on file  Intimate Partner Violence: Not on file    Past Surgical History:  Procedure Laterality Date  . COLONOSCOPY WITH PROPOFOL N/A 10/10/2018   Procedure: COLONOSCOPY WITH PROPOFOL;  Surgeon: Lin Landsman, MD;  Location: [facility] ENDOSCOPY;  Service: Gastroenterology;  Laterality: N/A;  . CYSTOSCOPY Bilateral 07/15/2019   Procedure: CYSTOSCOPY Ureteral injection of ICG for the purpose of ureteral identification ;  Surgeon: Hollice Espy, MD;  Location: [facility] ORS;  Service: Urology;  Laterality: Bilateral;  . CYSTOSCOPY W/ URETERAL STENT PLACEMENT Left 07/15/2019   Procedure: CYSTOSCOPY WITH RETROGRADE PYELOGRAM/URETERAL STENT PLACEMENT;  Surgeon: Hollice Espy, MD;  Location: [facility] ORS;  Service: Urology;  Laterality: Left;  . Excision of melanoma  2001  . INTRAUTERINE DEVICE (IUD) INSERTION  2009  . IUD REMOVAL  2014  . ROBOTIC ASSISTED TOTAL HYSTERECTOMY WITH BILATERAL SALPINGO OOPHERECTOMY Bilateral 07/15/2019   Procedure: XI ROBOTIC ASSISTED TOTAL HYSTERECTOMY WITH BILATERAL SALPINGECTOMY;  Surgeon: Homero Fellers, MD;  Location: [facility] ORS;  Service: Gynecology;  Laterality: Bilateral;  . WISDOM TOOTH EXTRACTION      Family  History  Problem Relation Age of Onset  . Hypertension Father   . Diabetes Father   . Hyperlipidemia Brother   . Hypertension Brother   . Colon cancer Paternal Grandfather 2  . Colon cancer Paternal Uncle 39  . Breast cancer Neg Hx     Allergies  Allergen Reactions  . Promethazine Other (See Comments)    uncontrollable reflex    Current Outpatient Medications on File Prior to Visit  Medication Sig Dispense Refill  . acetaminophen (TYLENOL) 500 MG tablet Take 2 tablets (1,000 mg total) by mouth every 6 (six) hours. 30 tablet 0  . ibuprofen (ADVIL) 600 MG tablet Take 1 tablet (600 mg  total) by mouth every 6 (six) hours as needed for fever or headache. 30 tablet 0  . Multiple Vitamin (MULTIVITAMIN WITH MINERALS) TABS tablet Take 1 tablet by mouth daily.     No current facility-administered medications on file prior to visit.    BP 120/62   Pulse 70   Temp 98.4 F (36.9 C) (Temporal)   Ht 5\' 4"  (1.626 m)   Wt 157 lb (71.2 kg)   LMP 06/06/2019 (Approximate)   SpO2 99%   BMI 26.95 kg/m  Objective:   Physical Exam Cardiovascular:     Rate and Rhythm: Normal rate and regular rhythm.  Pulmonary:     Effort: Pulmonary effort is normal.     Breath sounds: Normal breath sounds.  Skin:    General: Skin is warm and dry.     Findings: Erythema present.     Comments: Raised, tender, mildly erythematous spot to right anterior lower extremity proximal to ankle. No open wound. No drainage. No surrounding erythema. Located over anterior varicose veins.  Neurological:     Mental Status: She is alert.           Assessment & Plan:      This visit occurred during the SARS-CoV-2 public health emergency.  Safety protocols were in place, including screening questions prior to the visit, additional usage of staff PPE, and extensive cleaning of exam room while observing appropriate contact time as indicated for disinfecting solutions.

## 2020-05-20 NOTE — Assessment & Plan Note (Addendum)
Acute for the last 10 days, no significant improvement. Exam today without obvious infectious process or insect/tick bite.  Other differentials include DVT, superficial venous process, phlebitis.  Venous ultrasound ordered and pending to rule out DVT. Overall appears stable, reassuring that she's had some improvement in swelling and redness.  She will update if symptoms progress.

## 2020-05-20 NOTE — Addendum Note (Signed)
Addended by: Kris Mouton on: 05/20/2020 04:13 PM   Modules accepted: Orders

## 2020-05-23 ENCOUNTER — Other Ambulatory Visit: Payer: Self-pay

## 2020-05-23 ENCOUNTER — Ambulatory Visit
Admission: RE | Admit: 2020-05-23 | Discharge: 2020-05-23 | Disposition: A | Discharge status: Home / Self Care | Payer: BC Managed Care – PPO | Source: Ambulatory Visit | Attending: Primary Care | Admitting: Primary Care

## 2020-05-23 DIAGNOSIS — R2241 Localized swelling, mass and lump, right lower limb: Secondary | ICD-10-CM | POA: Diagnosis not present

## 2020-05-23 DIAGNOSIS — M7989 Other specified soft tissue disorders: Secondary | ICD-10-CM | POA: Diagnosis not present

## 2020-07-01 ENCOUNTER — Encounter: Payer: BC Managed Care – PPO | Admitting: Primary Care

## 2020-07-14 ENCOUNTER — Other Ambulatory Visit: Payer: Self-pay

## 2020-07-14 ENCOUNTER — Ambulatory Visit (INDEPENDENT_AMBULATORY_CARE_PROVIDER_SITE_OTHER): Payer: BC Managed Care – PPO | Admitting: Primary Care

## 2020-07-14 ENCOUNTER — Encounter: Payer: Self-pay | Admitting: Primary Care

## 2020-07-14 VITALS — BP 118/74 | HR 82 | Temp 98.6°F | Ht 64.0 in | Wt 157.0 lb

## 2020-07-14 DIAGNOSIS — Z1159 Encounter for screening for other viral diseases: Secondary | ICD-10-CM

## 2020-07-14 DIAGNOSIS — K5909 Other constipation: Secondary | ICD-10-CM | POA: Diagnosis not present

## 2020-07-14 DIAGNOSIS — Z1231 Encounter for screening mammogram for malignant neoplasm of breast: Secondary | ICD-10-CM

## 2020-07-14 DIAGNOSIS — R2241 Localized swelling, mass and lump, right lower limb: Secondary | ICD-10-CM

## 2020-07-14 DIAGNOSIS — Z0000 Encounter for general adult medical examination without abnormal findings: Secondary | ICD-10-CM | POA: Diagnosis not present

## 2020-07-14 DIAGNOSIS — Z114 Encounter for screening for human immunodeficiency virus [HIV]: Secondary | ICD-10-CM

## 2020-07-14 DIAGNOSIS — Z0001 Encounter for general adult medical examination with abnormal findings: Secondary | ICD-10-CM | POA: Insufficient documentation

## 2020-07-14 DIAGNOSIS — Z800 Family history of malignant neoplasm of digestive organs: Secondary | ICD-10-CM

## 2020-07-14 LAB — COMPREHENSIVE METABOLIC PANEL
ALT: 19 U/L (ref 0–35)
AST: 16 U/L (ref 0–37)
Albumin: 4.2 g/dL (ref 3.5–5.2)
Alkaline Phosphatase: 109 U/L (ref 39–117)
BUN: 7 mg/dL (ref 6–23)
CO2: 30 mEq/L (ref 19–32)
Calcium: 9.5 mg/dL (ref 8.4–10.5)
Chloride: 102 mEq/L (ref 96–112)
Creatinine, Ser: 0.61 mg/dL (ref 0.40–1.20)
GFR: 104.94 mL/min (ref >60.00)
Glucose, Bld: 80 mg/dL (ref 70–99)
Potassium: 3.7 mEq/L (ref 3.5–5.1)
Sodium: 138 mEq/L (ref 135–145)
Total Bilirubin: 1 mg/dL (ref 0.2–1.2)
Total Protein: 7 g/dL (ref 6.0–8.3)

## 2020-07-14 LAB — LIPID PANEL
Cholesterol: 147 mg/dL (ref 0–200)
HDL: 44.8 mg/dL (ref >39.00)
LDL Cholesterol: 83 mg/dL (ref 0–99)
NonHDL: 102.37
Total CHOL/HDL Ratio: 3
Triglycerides: 96 mg/dL (ref 0.0–149.0)
VLDL: 19.2 mg/dL (ref 0.0–40.0)

## 2020-07-14 LAB — CBC
HCT: 41.1 % (ref 36.0–46.0)
Hemoglobin: 14 g/dL (ref 12.0–15.0)
MCHC: 34 g/dL (ref 30.0–36.0)
MCV: 87.4 fl (ref 78.0–100.0)
Platelets: 284 10*3/uL (ref 150.0–400.0)
RBC: 4.7 Mil/uL (ref 3.87–5.11)
RDW: 13.3 % (ref 11.5–15.5)
WBC: 10.7 10*3/uL — ABNORMAL HIGH (ref 4.0–10.5)

## 2020-07-14 NOTE — Assessment & Plan Note (Signed)
Above the same, no irritation or changes. Venous US negative for DVT. Continue to monitor.

## 2020-07-14 NOTE — Assessment & Plan Note (Signed)
In grandfather and uncle. Colonoscopy is UTD, due in 2025.

## 2020-07-14 NOTE — Assessment & Plan Note (Signed)
Unsure of tetanus status.  Discussed the importance of a healthy diet and regular exercise in order for weight loss, and to reduce the risk of any potential medical problems.  Exam today stable. Labs pending.

## 2020-07-14 NOTE — Patient Instructions (Signed)
Stop by the lab prior to leaving today. I will notify you of your results once received.   [facility] to schedule your mammogram.   It was a pleasure to see you today!   Preventive Care 40-50 Years Old, Female Preventive care refers to lifestyle choices and visits with your health care provider that can promote health and wellness. This includes:  A yearly physical exam. This is also called an annual wellness visit.  Regular dental and eye exams.  Immunizations.  Screening for certain conditions.  Healthy lifestyle choices, such as: ? Eating a healthy diet. ? Getting regular exercise. ? Not using drugs or products that contain nicotine and tobacco. ? Limiting alcohol use. What can I expect for my preventive care visit? Physical exam Your health care provider will check your:  Height and weight. These may be used to calculate your BMI (body mass index). BMI is a measurement that tells if you are at a healthy weight.  Heart rate and blood pressure.  Body temperature.  Skin for abnormal spots. Counseling Your health care provider may ask you questions about your:  Past medical problems.  Family's medical history.  Alcohol, tobacco, and drug use.  Emotional well-being.  Home life and relationship well-being.  Sexual activity.  Diet, exercise, and sleep habits.  Work and work environment.  Access to firearms.  Method of birth control.  Menstrual cycle.  Pregnancy history. What immunizations do I need? Vaccines are usually given at various ages, according to a schedule. Your health care provider will recommend vaccines for you based on your age, medical history, and lifestyle or other factors, such as travel or where you work.   What tests do I need? Blood tests  Lipid and cholesterol levels. These may be checked every 5 years, or more often if you are over 50 years old.  Hepatitis C test.  Hepatitis B test. Screening  Lung cancer  screening. You may have this screening every year starting at age 55 if you have a 30-pack-year history of smoking and currently smoke or have quit within the past 15 years.  Colorectal cancer screening. ? All adults should have this screening starting at age 50 and continuing until age 75. ? Your health care provider may recommend screening at age 45 if you are at increased risk. ? You will have tests every 1-10 years, depending on your results and the type of screening test.  Diabetes screening. ? This is done by checking your blood sugar (glucose) after you have not eaten for a while (fasting). ? You may have this done every 1-3 years.  Mammogram. ? This may be done every 1-2 years. ? Talk with your health care provider about when you should start having regular mammograms. This may depend on whether you have a family history of breast cancer.  BRCA-related cancer screening. This may be done if you have a family history of breast, ovarian, tubal, or peritoneal cancers.  Pelvic exam and Pap test. ? This may be done every 3 years starting at age 21. ? Starting at age 30, this may be done every 5 years if you have a Pap test in combination with an HPV test. Other tests  STD (sexually transmitted disease) testing, if you are at risk.  Bone density scan. This is done to screen for osteoporosis. You may have this scan if you are at high risk for osteoporosis. Talk with your health care provider about your test results, treatment options, and   if necessary, the need for more tests. Follow these instructions at home: Eating and drinking  Eat a diet that includes fresh fruits and vegetables, whole grains, lean protein, and low-fat dairy products.  Take vitamin and mineral supplements as recommended by your health care provider.  Do not drink alcohol if: ? Your health care provider tells you not to drink. ? You are pregnant, may be pregnant, or are planning to become pregnant.  If you  drink alcohol: ? Limit how much you have to 0-1 drink a day. ? Be aware of how much alcohol is in your drink. In the [place] one drink equals one 12 oz bottle of beer (355 mL), one 5 oz glass of wine (148 mL), or one 1 oz glass of hard liquor (44 mL).   Lifestyle  Take daily care of your teeth and gums. Brush your teeth every morning and night with fluoride toothpaste. Floss one time each day.  Stay active. Exercise for at least 30 minutes 5 or more days each week.  Do not use any products that contain nicotine or tobacco, such as cigarettes, e-cigarettes, and chewing tobacco. If you need help quitting, ask your health care provider.  Do not use drugs.  If you are sexually active, practice safe sex. Use a condom or other form of protection to prevent STIs (sexually transmitted infections).  If you do not wish to become pregnant, use a form of birth control. If you plan to become pregnant, see your health care provider for a prepregnancy visit.  If told by your health care provider, take low-dose aspirin daily starting at age 67.  Find healthy ways to cope with stress, such as: ? Meditation, yoga, or listening to music. ? Journaling. ? Talking to a trusted person. ? Spending time with friends and family. Safety  Always wear your seat belt while driving or riding in a vehicle.  Do not drive: ? If you have been drinking alcohol. Do not ride with someone who has been drinking. ? When you are tired or distracted. ? While texting.  Wear a helmet and other protective equipment during sports activities.  If you have firearms in your house, make sure you follow all gun safety procedures. What's next?  Visit your health care provider once a year for an annual wellness visit.  Ask your health care provider how often you should have your eyes and teeth checked.  Stay up to date on all vaccines. This information is not intended to replace advice given to you by your health care provider.  Make sure you discuss any questions you have with your health care provider. Document Revised: 11/17/2019 Document Reviewed: 10/24/2017 Elsevier Patient Education  2021 [employer].

## 2020-07-14 NOTE — Assessment & Plan Note (Addendum)
Doing well on OTC stool softener a few days weekly, continue same.

## 2020-07-14 NOTE — Progress Notes (Signed)
Subjective:    Patient ID: Cheryl Schmidt, female    DOB: [birth year], 50 y.o.   MRN: [id number]  HPI  Cheryl Schmidt is a very pleasant 50 y.o. female who presents today for complete physical.  Immunizations: -Tetanus: Unsure -Influenza: Completed last season  -Covid-19: Completed three vaccines   Diet: She endorses a fair diet.  Exercise: She walks some  Eye exam: No recent exam Dental exam: Completes semi-annually  Pap Smear: 2020 Mammogram: June 2021 Colonoscopy: 2020, due in 2025  BP Readings from Last 3 Encounters:  07/14/20 118/74  05/20/20 120/62  10/08/19 120/70     Review of Systems  Constitutional: Negative for unexpected weight change.  HENT: Negative for rhinorrhea.   Eyes: Negative for visual disturbance.  Respiratory: Negative for cough and shortness of breath.   Cardiovascular: Negative for chest pain.  Gastrointestinal: Negative for constipation and diarrhea.  Genitourinary: Negative for difficulty urinating.  Musculoskeletal: Negative for arthralgias.  Skin: Negative for rash.  Allergic/Immunologic: Negative for environmental allergies.  Neurological: Negative for dizziness, numbness and headaches.  Psychiatric/Behavioral: The patient is not nervous/anxious.          Past Medical History:  Diagnosis Date  . Family history of adverse reaction to anesthesia    mother has N/V  . Fibroids   . Headache   . Melanoma ([place]) 2001   upper abdomen Dr Nehemiah Massed removed  . Menorrhagia with regular cycle   . Ovarian cyst   . Uterine bleeding     Social History   Socioeconomic History  . Marital status: Married    Spouse name: Gaspar Bidding  . Number of children: 2  . Years of education: Not on file  . Highest education level: Not on file  Occupational History  . Occupation: Medical illustrator  Tobacco Use  . Smoking status: Never Smoker  . Smokeless tobacco: Never Used  Vaping Use  . Vaping Use: Never used  Substance and Sexual Activity  . Alcohol  use: Yes    Comment: 1 glass of wine/month  . Drug use: Never  . Sexual activity: Yes    Partners: Male    Birth control/protection: Surgical    Comment: vasectomy  Other Topics Concern  . Not on file  Social History Narrative   Married.   2 children.   Works in Insurance underwriter.   Enjoys spending time with family, walking, exercising.   Social Determinants of Health   Financial Resource Strain: Not on file  Food Insecurity: Not on file  Transportation Needs: Not on file  Physical Activity: Not on file  Stress: Not on file  Social Connections: Not on file  Intimate Partner Violence: Not on file    Past Surgical History:  Procedure Laterality Date  . COLONOSCOPY WITH PROPOFOL N/A 10/10/2018   Procedure: COLONOSCOPY WITH PROPOFOL;  Surgeon: Lin Landsman, MD;  Location: [facility] ENDOSCOPY;  Service: Gastroenterology;  Laterality: N/A;  . CYSTOSCOPY Bilateral 07/15/2019   Procedure: CYSTOSCOPY Ureteral injection of ICG for the purpose of ureteral identification ;  Surgeon: Hollice Espy, MD;  Location: [facility] ORS;  Service: Urology;  Laterality: Bilateral;  . CYSTOSCOPY W/ URETERAL STENT PLACEMENT Left 07/15/2019   Procedure: CYSTOSCOPY WITH RETROGRADE PYELOGRAM/URETERAL STENT PLACEMENT;  Surgeon: Hollice Espy, MD;  Location: [facility] ORS;  Service: Urology;  Laterality: Left;  . Excision of melanoma  2001  . INTRAUTERINE DEVICE (IUD) INSERTION  2009  . IUD REMOVAL  2014  . ROBOTIC ASSISTED TOTAL HYSTERECTOMY WITH BILATERAL SALPINGO OOPHERECTOMY Bilateral  07/15/2019   Procedure: XI ROBOTIC ASSISTED TOTAL HYSTERECTOMY WITH BILATERAL SALPINGECTOMY;  Surgeon: Homero Fellers, MD;  Location: [facility] ORS;  Service: Gynecology;  Laterality: Bilateral;  . WISDOM TOOTH EXTRACTION      Family History  Problem Relation Age of Onset  . Hypertension Father   . Diabetes Father   . Hyperlipidemia Brother   . Hypertension Brother   . Colon cancer Paternal Grandfather 62  . Colon cancer  Paternal Uncle 30  . Breast cancer Neg Hx     Allergies  Allergen Reactions  . Promethazine Other (See Comments)    uncontrollable reflex    Current Outpatient Medications on File Prior to Visit  Medication Sig Dispense Refill  . acetaminophen (TYLENOL) 500 MG tablet Take 2 tablets (1,000 mg total) by mouth every 6 (six) hours. 30 tablet 0  . ibuprofen (ADVIL) 600 MG tablet Take 1 tablet (600 mg total) by mouth every 6 (six) hours as needed for fever or headache. 30 tablet 0  . Multiple Vitamin (MULTIVITAMIN WITH MINERALS) TABS tablet Take 1 tablet by mouth daily.     No current facility-administered medications on file prior to visit.    BP 118/74   Pulse 82   Temp 98.6 F (37 C) (Temporal)   Ht 5\' 4"  (1.626 m)   Wt 157 lb (71.2 kg)   LMP 06/06/2019 (Approximate)   SpO2 98%   BMI 26.95 kg/m  Objective:   Physical Exam HENT:     Right Ear: Tympanic membrane and ear canal normal.     Left Ear: Tympanic membrane and ear canal normal.     Nose: Nose normal.  Eyes:     Conjunctiva/sclera: Conjunctivae normal.     Pupils: Pupils are equal, round, and reactive to light.  Neck:     Thyroid: No thyromegaly.  Cardiovascular:     Rate and Rhythm: Normal rate and regular rhythm.     Heart sounds: No murmur heard.   Pulmonary:     Effort: Pulmonary effort is normal.     Breath sounds: Normal breath sounds. No rales.  Abdominal:     General: Bowel sounds are normal.     Palpations: Abdomen is soft.     Tenderness: There is no abdominal tenderness.  Musculoskeletal:        General: Normal range of motion.     Cervical back: Neck supple.  Lymphadenopathy:     Cervical: No cervical adenopathy.  Skin:    General: Skin is warm and dry.     Findings: No rash.  Neurological:     Mental Status: She is alert and oriented to person, place, and time.     Cranial Nerves: No cranial nerve deficit.     Deep Tendon Reflexes: Reflexes are normal and symmetric.  Psychiatric:         Mood and Affect: Mood normal.           Assessment & Plan:      This visit occurred during the SARS-CoV-2 public health emergency.  Safety protocols were in place, including screening questions prior to the visit, additional usage of staff PPE, and extensive cleaning of exam room while observing appropriate contact time as indicated for disinfecting solutions.

## 2020-07-15 LAB — HIV ANTIBODY (ROUTINE TESTING W REFLEX): HIV 1&2 Ab, 4th Generation: NONREACTIVE

## 2020-07-15 LAB — HEPATITIS C ANTIBODY
Hepatitis C Ab: NONREACTIVE
SIGNAL TO CUT-OFF: 0 (ref <1.00)

## 2020-09-23 ENCOUNTER — Ambulatory Visit
Admission: RE | Admit: 2020-09-23 | Discharge: 2020-09-23 | Disposition: A | Discharge status: Home / Self Care | Payer: BC Managed Care – PPO | Source: Ambulatory Visit | Attending: Primary Care | Admitting: Primary Care

## 2020-09-23 ENCOUNTER — Other Ambulatory Visit: Payer: Self-pay

## 2020-09-23 DIAGNOSIS — Z1231 Encounter for screening mammogram for malignant neoplasm of breast: Secondary | ICD-10-CM | POA: Diagnosis not present

## 2021-08-24 ENCOUNTER — Other Ambulatory Visit: Payer: Self-pay | Admitting: Primary Care

## 2021-08-24 DIAGNOSIS — Z1231 Encounter for screening mammogram for malignant neoplasm of breast: Secondary | ICD-10-CM

## 2021-08-30 DIAGNOSIS — W010XXA Fall on same level from slipping, tripping and stumbling without subsequent striking against object, initial encounter: Secondary | ICD-10-CM | POA: Diagnosis not present

## 2021-08-30 DIAGNOSIS — R0781 Pleurodynia: Secondary | ICD-10-CM | POA: Diagnosis not present

## 2021-08-30 DIAGNOSIS — S20211A Contusion of right front wall of thorax, initial encounter: Secondary | ICD-10-CM | POA: Diagnosis not present

## 2021-08-30 DIAGNOSIS — Y92009 Unspecified place in unspecified non-institutional (private) residence as the place of occurrence of the external cause: Secondary | ICD-10-CM | POA: Diagnosis not present

## 2021-09-21 ENCOUNTER — Ambulatory Visit
Admission: EM | Admit: 2021-09-21 | Discharge: 2021-09-21 | Disposition: A | Discharge status: Home / Self Care | Payer: BC Managed Care – PPO | Attending: Emergency Medicine | Admitting: Emergency Medicine

## 2021-09-21 ENCOUNTER — Other Ambulatory Visit: Payer: Self-pay

## 2021-09-21 ENCOUNTER — Encounter: Payer: Self-pay | Admitting: Emergency Medicine

## 2021-09-21 DIAGNOSIS — J069 Acute upper respiratory infection, unspecified: Secondary | ICD-10-CM | POA: Insufficient documentation

## 2021-09-21 LAB — POCT RAPID STREP A (OFFICE): Rapid Strep A Screen: NEGATIVE

## 2021-09-21 MED ORDER — BENZONATATE 100 MG PO CAPS: 100.0000 mg | ORAL_CAPSULE | Freq: Three times a day (TID) | ORAL | 0 refills | Status: DC

## 2021-09-21 MED ORDER — HYDROCOD POLI-CHLORPHE POLI ER 10-8 MG/5ML PO SUER: 5.0000 mL | Freq: Two times a day (BID) | ORAL | 0 refills | Status: DC | PRN

## 2021-09-21 NOTE — Discharge Instructions (Signed)
Your symptoms today are most likely being caused by a virus and should steadily improve in time it can take up to 7 to 10 days before you truly start to see a turnaround however things will get better  Rapid strep test is negative for bacteria  May use Tessalon pill every 8 hours to help calm your coughing  You may use Tussionex every 12 hours for additional support, be mindful this medication may make you drowsy    You can take Tylenol and/or Ibuprofen as needed for fever reduction and pain relief.   For cough: honey 1/2 to 1 teaspoon (you can dilute the honey in water or another fluid).  You can also use guaifenesin and dextromethorphan for cough. You can use a humidifier for chest congestion and cough.  If you don't have a humidifier, you can sit in the bathroom with the hot shower running.      For sore throat: try warm salt water gargles, cepacol lozenges, throat spray, warm tea or water with lemon/honey, popsicles or ice, or OTC cold relief medicine for throat discomfort.   For congestion: take a daily anti-histamine like Zyrtec, Claritin, and a oral decongestant, such as pseudoephedrine.  You can also use Flonase 1-2 sprays in each nostril daily.   It is important to stay hydrated: drink plenty of fluids (water, gatorade/powerade/pedialyte, juices, or teas) to keep your throat moisturized and help further relieve irritation/discomfort.  If after 10 days you have been using no improvement in your symptoms on September 26, 2021 Augmentin will be at the pharmacy for you, take every morning and every evening for the next 7 days

## 2021-09-21 NOTE — ED Triage Notes (Signed)
Patient c/o sore throat and RT sided ear pain x 5 days.   Patient endorses a temperature of 101 F at home.   Patient endorses painful swallowing.   Patient endorses productive cough. Patient endorses runny nose.   Patient took at home COVID test with negative result.   Patient has taken Tylenol and ibuprofen with some relief of symptoms.

## 2021-09-21 NOTE — ED Provider Notes (Signed)
Roderic Palau    CSN: [id number] Arrival date & time: 09/21/21  8786      History   Chief Complaint Chief Complaint  Patient presents with   Otalgia   Sore Throat    HPI Cheryl Schmidt is a 51 y.o. female.   Patient presents with fevers, nasal congestion, rhinorrhea, right-sided ear pain, sore throat, productive cough for 5 days.  Symptoms began after returning from vacation.  No known sick contacts and no other members of household are ill.  Tolerating food and liquids.  Has attempted use of ibuprofen, Tylenol, Chloraseptic spray and cough drops which have been minimally helpful.  No pertinent medical history.  Denies shortness of breath, wheezing.  Past Medical History:  Diagnosis Date   Abnormal uterine bleeding due to leiomyoma of uterus    Family history of adverse reaction to anesthesia    mother has N/V   Fibroids    Headache    Intramural leiomyoma of uterus 06/10/2016   Melanoma ([place]) 2001   upper abdomen Dr Nehemiah Massed removed   Menorrhagia with regular cycle    Ovarian cyst    S/P hysterectomy 07/15/2019   Uterine bleeding    Uterine leiomyoma     Patient Active Problem List   Diagnosis Date Noted   Preventative health care 07/14/2020   Localized swelling of right lower extremity 05/20/2020   S/P total hysterectomy 07/15/2019   Occult blood in stools    Family history of colon cancer    Chronic constipation 11/12/2016   Lower abdominal pain 11/12/2016    Past Surgical History:  Procedure Laterality Date   COLONOSCOPY WITH PROPOFOL N/A 10/10/2018   Procedure: COLONOSCOPY WITH PROPOFOL;  Surgeon: Lin Landsman, MD;  Location: [facility];  Service: Gastroenterology;  Laterality: N/A;   CYSTOSCOPY Bilateral 07/15/2019   Procedure: CYSTOSCOPY Ureteral injection of ICG for the purpose of ureteral identification ;  Surgeon: Hollice Espy, MD;  Location: [facility];  Service: Urology;  Laterality: Bilateral;   CYSTOSCOPY W/ URETERAL STENT PLACEMENT  Left 07/15/2019   Procedure: CYSTOSCOPY WITH RETROGRADE PYELOGRAM/URETERAL STENT PLACEMENT;  Surgeon: Hollice Espy, MD;  Location: [facility];  Service: Urology;  Laterality: Left;   Excision of melanoma  2001   INTRAUTERINE DEVICE (IUD) INSERTION  2009   IUD REMOVAL  2014   ROBOTIC ASSISTED TOTAL HYSTERECTOMY WITH BILATERAL SALPINGO OOPHERECTOMY Bilateral 07/15/2019   Procedure: XI ROBOTIC ASSISTED TOTAL HYSTERECTOMY WITH BILATERAL SALPINGECTOMY;  Surgeon: Homero Fellers, MD;  Location: [facility];  Service: Gynecology;  Laterality: Bilateral;   WISDOM TOOTH EXTRACTION      OB History     Gravida  2   Para  2   Term  2   Preterm      AB      Living  2      SAB      IAB      Ectopic      Multiple      Live Births  2            Home Medications    Prior to Admission medications   Medication Sig Start Date End Date Taking? Authorizing Provider  acetaminophen (TYLENOL) 500 MG tablet Take 2 tablets (1,000 mg total) by mouth every 6 (six) hours. 07/17/19  Yes Schuman, Christanna R, MD  ibuprofen (ADVIL) 600 MG tablet Take 1 tablet (600 mg total) by mouth every 6 (six) hours as needed for fever or headache. 07/17/19  Yes Schuman, Christanna  R, MD  Multiple Vitamin (MULTIVITAMIN WITH MINERALS) TABS tablet Take 1 tablet by mouth daily.   Yes [provider]    Family History Family History  Problem Relation Age of Onset   Hypertension Father    Diabetes Father    Hyperlipidemia Brother    Hypertension Brother    Colon cancer Paternal Grandfather 43   Colon cancer Paternal Uncle 60   Breast cancer Neg Hx     Social History Social History   Tobacco Use   Smoking status: Never   Smokeless tobacco: Never  Vaping Use   Vaping Use: Never used  Substance Use Topics   Alcohol use: Yes    Comment: 1 glass of wine/month   Drug use: Never     Allergies   Promethazine   Review of Systems Review of Systems Defer to HPI    Physical  Exam Triage Vital Signs ED Triage Vitals  Enc Vitals Group     BP 09/21/21 0938 127/85     Pulse Rate 09/21/21 0938 76     Resp 09/21/21 0938 16     Temp 09/21/21 0938 98.5 F (36.9 C)     Temp Source 09/21/21 0938 Oral     SpO2 09/21/21 0938 97 %     Weight --      Height --      Head Circumference --      Peak Flow --      Pain Score 09/21/21 0941 8     Pain Loc --      Pain Edu? --      Excl. in [place]? --    No data found.  Updated Vital Signs BP 127/85 (BP Location: Left Arm)   Pulse 76   Temp 98.5 F (36.9 C) (Oral)   Resp 16   LMP 06/06/2019 (Approximate)   SpO2 97%   Visual Acuity Right Eye Distance:   Left Eye Distance:   Bilateral Distance:    Right Eye Near:   Left Eye Near:    Bilateral Near:     Physical Exam Constitutional:      Appearance: She is well-developed.  HENT:     Head: Normocephalic.     Right Ear: Tympanic membrane and ear canal normal.     Left Ear: Tympanic membrane and ear canal normal.     Nose: Congestion and rhinorrhea present.     Mouth/Throat:     Mouth: Mucous membranes are moist.     Pharynx: Posterior oropharyngeal erythema present.     Tonsils: No tonsillar exudate. 1+ on the right. 1+ on the left.  Cardiovascular:     Rate and Rhythm: Normal rate and regular rhythm.     Heart sounds: Normal heart sounds.  Pulmonary:     Effort: Pulmonary effort is normal.     Breath sounds: Normal breath sounds.  Musculoskeletal:     Cervical back: Normal range of motion and neck supple.  Skin:    General: Skin is warm and dry.  Neurological:     General: No focal deficit present.     Mental Status: She is alert and oriented to person, place, and time.  Psychiatric:        Mood and Affect: Mood normal.        Behavior: Behavior normal.      UC Treatments / Results  Labs (all labs ordered are listed, but only abnormal results are displayed) Labs Reviewed  POCT RAPID STREP A (OFFICE)  EKG   Radiology No results  found.  Procedures Procedures (including critical care time)  Medications Ordered in UC Medications - No data to display  Initial Impression / Assessment and Plan / UC Course  I have reviewed the triage vital signs and the nursing notes.  Pertinent labs & imaging results that were available during my care of the patient were reviewed by me and considered in my medical decision making (see chart for details).  Viral URI with cough  Vital signs are stable patient is in no signs of distress, low suspicion for pneumonia, bronchitis or pneumothorax therefore deferred image change today, rapid strep test negative, sent for culture, discussed findings with patient, etiology is most likely viral, prescribed Tessalon and Tussionex for management of coughing as this is the most worrisome symptom today, watchful wait antibiotic placed at pharmacy for day 10 of illness, Augmentin prescribed, may use additional over-the-counter medications as needed for supportive care, may follow-up with this urgent care as needed Final Clinical Impressions(s) / UC Diagnoses   Final diagnoses:  None   Discharge Instructions   None    ED Prescriptions   None    PDMP not reviewed this encounter.   Hans Eden, NP 09/21/21 1022

## 2021-09-23 LAB — CULTURE, GROUP A STREP (THRC)

## 2021-09-25 ENCOUNTER — Ambulatory Visit
Admission: RE | Admit: 2021-09-25 | Discharge: 2021-09-25 | Disposition: A | Discharge status: Home / Self Care | Payer: BC Managed Care – PPO | Source: Ambulatory Visit | Attending: Primary Care | Admitting: Primary Care

## 2021-09-25 DIAGNOSIS — Z1231 Encounter for screening mammogram for malignant neoplasm of breast: Secondary | ICD-10-CM | POA: Diagnosis not present

## 2021-09-27 ENCOUNTER — Other Ambulatory Visit: Payer: Self-pay | Admitting: Primary Care

## 2021-09-27 DIAGNOSIS — R928 Other abnormal and inconclusive findings on diagnostic imaging of breast: Secondary | ICD-10-CM

## 2021-09-27 DIAGNOSIS — N6489 Other specified disorders of breast: Secondary | ICD-10-CM

## 2021-10-04 ENCOUNTER — Ambulatory Visit
Admission: RE | Admit: 2021-10-04 | Discharge: 2021-10-04 | Disposition: A | Discharge status: Home / Self Care | Payer: BC Managed Care – PPO | Source: Ambulatory Visit | Attending: Primary Care | Admitting: Primary Care

## 2021-10-04 DIAGNOSIS — R928 Other abnormal and inconclusive findings on diagnostic imaging of breast: Secondary | ICD-10-CM

## 2021-10-04 DIAGNOSIS — N6489 Other specified disorders of breast: Secondary | ICD-10-CM

## 2021-10-13 ENCOUNTER — Encounter: Payer: BC Managed Care – PPO | Admitting: Primary Care

## 2021-10-13 ENCOUNTER — Encounter: Payer: Self-pay | Admitting: Primary Care

## 2021-10-13 ENCOUNTER — Ambulatory Visit (INDEPENDENT_AMBULATORY_CARE_PROVIDER_SITE_OTHER): Payer: BC Managed Care – PPO | Admitting: Primary Care

## 2021-10-13 VITALS — BP 127/84 | HR 76 | Temp 98.6°F | Ht 64.0 in | Wt 161.0 lb

## 2021-10-13 DIAGNOSIS — Z800 Family history of malignant neoplasm of digestive organs: Secondary | ICD-10-CM

## 2021-10-13 DIAGNOSIS — K5909 Other constipation: Secondary | ICD-10-CM | POA: Diagnosis not present

## 2021-10-13 DIAGNOSIS — Z0000 Encounter for general adult medical examination without abnormal findings: Secondary | ICD-10-CM | POA: Diagnosis not present

## 2021-10-13 LAB — COMPREHENSIVE METABOLIC PANEL
ALT: 37 U/L — ABNORMAL HIGH (ref 0–35)
AST: 23 U/L (ref 0–37)
Albumin: 4.1 g/dL (ref 3.5–5.2)
Alkaline Phosphatase: 104 U/L (ref 39–117)
BUN: 9 mg/dL (ref 6–23)
CO2: 26 mEq/L (ref 19–32)
Calcium: 8.9 mg/dL (ref 8.4–10.5)
Chloride: 105 mEq/L (ref 96–112)
Creatinine, Ser: 0.67 mg/dL (ref 0.40–1.20)
GFR: 101.7 mL/min (ref >60.00)
Glucose, Bld: 89 mg/dL (ref 70–99)
Potassium: 4 mEq/L (ref 3.5–5.1)
Sodium: 139 mEq/L (ref 135–145)
Total Bilirubin: 0.8 mg/dL (ref 0.2–1.2)
Total Protein: 6.9 g/dL (ref 6.0–8.3)

## 2021-10-13 LAB — CBC WITH DIFFERENTIAL/PLATELET
Basophils Absolute: 0.1 10*3/uL (ref 0.0–0.1)
Basophils Relative: 0.9 % (ref 0.0–3.0)
Eosinophils Absolute: 0.2 10*3/uL (ref 0.0–0.7)
Eosinophils Relative: 2.3 % (ref 0.0–5.0)
HCT: 41.9 % (ref 36.0–46.0)
Hemoglobin: 14 g/dL (ref 12.0–15.0)
Lymphocytes Relative: 21 % (ref 12.0–46.0)
Lymphs Abs: 1.7 10*3/uL (ref 0.7–4.0)
MCHC: 33.4 g/dL (ref 30.0–36.0)
MCV: 89.8 fl (ref 78.0–100.0)
Monocytes Absolute: 0.6 10*3/uL (ref 0.1–1.0)
Monocytes Relative: 7.7 % (ref 3.0–12.0)
Neutro Abs: 5.5 10*3/uL (ref 1.4–7.7)
Neutrophils Relative %: 68.1 % (ref 43.0–77.0)
Platelets: 314 10*3/uL (ref 150.0–400.0)
RBC: 4.66 Mil/uL (ref 3.87–5.11)
RDW: 13.1 % (ref 11.5–15.5)
WBC: 8.1 10*3/uL (ref 4.0–10.5)

## 2021-10-13 LAB — LIPID PANEL
Cholesterol: 160 mg/dL (ref 0–200)
HDL: 40.7 mg/dL (ref >39.00)
LDL Cholesterol: 103 mg/dL — ABNORMAL HIGH (ref 0–99)
NonHDL: 119.17
Total CHOL/HDL Ratio: 4
Triglycerides: 82 mg/dL (ref 0.0–149.0)
VLDL: 16.4 mg/dL (ref 0.0–40.0)

## 2021-10-13 NOTE — Progress Notes (Signed)
Subjective:    Patient ID: Cheryl Schmidt, female    DOB: 1970/08/10, 51 y.o.   MRN: [id number]  HPI  TYLAH Schmidt is a very pleasant 51 y.o. female who presents today for complete physical and follow up of chronic conditions.  Immunizations: -Tetanus: UTD per patient  -Influenza: Due today  -Covid-19: 3 vaccines -Shingles: Never completed  Diet: Fair diet.  Exercise: Regular exercise.  Eye exam: Completes annually  Dental exam: Completes semi-annually   Pap Smear: Hysterectomy Mammogram: Completed in August 2023  Colonoscopy: Completed in 2020, due 2025  BP Readings from Last 3 Encounters:  10/13/21 127/84  09/21/21 127/85  07/14/20 118/74        Review of Systems  Constitutional:  Negative for unexpected weight change.  HENT:  Negative for rhinorrhea.   Respiratory:  Negative for cough and shortness of breath.   Cardiovascular:  Negative for chest pain.  Gastrointestinal:  Negative for constipation and diarrhea.  Genitourinary:  Negative for difficulty urinating and menstrual problem.  Musculoskeletal:  Negative for arthralgias and myalgias.  Skin:  Negative for rash.  Allergic/Immunologic: Negative for environmental allergies.  Neurological:  Negative for dizziness and headaches.  Psychiatric/Behavioral:  The patient is not nervous/anxious.          Past Medical History:  Diagnosis Date   Abnormal uterine bleeding due to leiomyoma of uterus    Family history of adverse reaction to anesthesia    mother has N/V   Fibroids    Headache    Intramural leiomyoma of uterus 06/10/2016   Localized swelling of right lower extremity 05/20/2020   Melanoma ([place]) 2001   upper abdomen Dr Nehemiah Massed removed   Menorrhagia with regular cycle    Occult blood in stools    Ovarian cyst    S/P hysterectomy 07/15/2019   Uterine bleeding    Uterine leiomyoma     Social History   Socioeconomic History   Marital status: Married    Spouse name: Cheryl Schmidt   Number of  children: 2   Years of education: Not on file   Highest education level: Not on file  Occupational History   Occupation: Medical illustrator  Tobacco Use   Smoking status: Never   Smokeless tobacco: Never  Vaping Use   Vaping Use: Never used  Substance and Sexual Activity   Alcohol use: Yes    Comment: 1 glass of wine/month   Drug use: Never   Sexual activity: Yes    Partners: Male    Birth control/protection: Surgical    Comment: vasectomy  Other Topics Concern   Not on file  Social History Narrative   Married.   2 children.   Works in Insurance underwriter.   Enjoys spending time with family, walking, exercising.   Social Determinants of Health   Financial Resource Strain: Not on file  Food Insecurity: Not on file  Transportation Needs: Not on file  Physical Activity: Insufficiently Active (09/23/2018)   Exercise Vital Sign    Days of Exercise per Week: 2 days    Minutes of Exercise per Session: 60 min  Stress: No Stress Concern Present (09/23/2018)   [place]    Feeling of Stress : Only a little  Social Connections: Not on file  Intimate Partner Violence: Not on file    Past Surgical History:  Procedure Laterality Date   COLONOSCOPY WITH PROPOFOL N/A 10/10/2018   Procedure: COLONOSCOPY WITH PROPOFOL;  Surgeon: Lin Landsman,  MD;  Location: [facility];  Service: Gastroenterology;  Laterality: N/A;   CYSTOSCOPY Bilateral 07/15/2019   Procedure: CYSTOSCOPY Ureteral injection of ICG for the purpose of ureteral identification ;  Surgeon: Hollice Espy, MD;  Location: [facility];  Service: Urology;  Laterality: Bilateral;   CYSTOSCOPY W/ URETERAL STENT PLACEMENT Left 07/15/2019   Procedure: CYSTOSCOPY WITH RETROGRADE PYELOGRAM/URETERAL STENT PLACEMENT;  Surgeon: Hollice Espy, MD;  Location: [facility];  Service: Urology;  Laterality: Left;   Excision of melanoma  2001   INTRAUTERINE DEVICE (IUD) INSERTION  2009    IUD REMOVAL  2014   ROBOTIC ASSISTED TOTAL HYSTERECTOMY WITH BILATERAL SALPINGO OOPHERECTOMY Bilateral 07/15/2019   Procedure: XI ROBOTIC ASSISTED TOTAL HYSTERECTOMY WITH BILATERAL SALPINGECTOMY;  Surgeon: Homero Fellers, MD;  Location: [facility];  Service: Gynecology;  Laterality: Bilateral;   WISDOM TOOTH EXTRACTION      Family History  Problem Relation Age of Onset   Hypertension Father    Diabetes Father    Hyperlipidemia Brother    Hypertension Brother    Colon cancer Paternal Grandfather 39   Colon cancer Paternal Uncle 13   Breast cancer Neg Hx     Allergies  Allergen Reactions   Promethazine Other (See Comments)    uncontrollable reflex    Current Outpatient Medications on File Prior to Visit  Medication Sig Dispense Refill   acetaminophen (TYLENOL) 500 MG tablet Take 2 tablets (1,000 mg total) by mouth every 6 (six) hours. 30 tablet 0   ibuprofen (ADVIL) 600 MG tablet Take 1 tablet (600 mg total) by mouth every 6 (six) hours as needed for fever or headache. 30 tablet 0   Multiple Vitamin (MULTIVITAMIN WITH MINERALS) TABS tablet Take 1 tablet by mouth daily.     No current facility-administered medications on file prior to visit.    BP 127/84   Pulse 76   Temp 98.6 F (37 C) (Oral)   Ht '5\' 4"'$  (1.626 m)   Wt 161 lb (73 kg)   LMP 06/06/2019 (Approximate)   SpO2 98%   BMI 27.64 kg/m  Objective:   Physical Exam HENT:     Right Ear: Tympanic membrane and ear canal normal.     Left Ear: Tympanic membrane and ear canal normal.     Nose: Nose normal.  Eyes:     Conjunctiva/sclera: Conjunctivae normal.     Pupils: Pupils are equal, round, and reactive to light.  Neck:     Thyroid: No thyromegaly.  Cardiovascular:     Rate and Rhythm: Normal rate and regular rhythm.     Heart sounds: No murmur heard. Pulmonary:     Effort: Pulmonary effort is normal.     Breath sounds: Normal breath sounds. No rales.  Abdominal:     General: Bowel sounds are normal.      Palpations: Abdomen is soft.     Tenderness: There is no abdominal tenderness.  Musculoskeletal:        General: Normal range of motion.     Cervical back: Neck supple.  Lymphadenopathy:     Cervical: No cervical adenopathy.  Skin:    General: Skin is warm and dry.     Findings: No rash.  Neurological:     Mental Status: She is alert and oriented to person, place, and time.     Cranial Nerves: No cranial nerve deficit.     Deep Tendon Reflexes: Reflexes are normal and symmetric.  Psychiatric:        Mood and  Affect: Mood normal.           Assessment & Plan:   Problem List Items Addressed This Visit       Digestive   Chronic constipation    Stable. No concerns today.   Continue stool softener daily PRN.          Other   Family history of colon cancer    Colonoscopy UTD, due 2025      Preventative health care - Primary    First dose of Shingrix provided, influenza vaccine provided.  Mammogram UTD. Colonoscopy UTD, due 2025.  Discussed the importance of a healthy diet and regular exercise in order for weight loss, and to reduce the risk of further co-morbidity.  Exam stable. Labs pending.  Follow up in 1 year for repeat physical.       Relevant Orders   CBC with Differential/Platelet   Comprehensive metabolic panel   Lipid panel       Pleas Koch, NP

## 2021-10-13 NOTE — Patient Instructions (Signed)
Stop by the lab prior to leaving today. I will notify you of your results once received.   It was a pleasure to see you today!  Preventive Care 78-51 Years Old, Female Preventive care refers to lifestyle choices and visits with your health care provider that can promote health and wellness. Preventive care visits are also called wellness exams. What can I expect for my preventive care visit? Counseling Your health care provider may ask you questions about your: Medical history, including: Past medical problems. Family medical history. Pregnancy history. Current health, including: Menstrual cycle. Method of birth control. Emotional well-being. Home life and relationship well-being. Sexual activity and sexual health. Lifestyle, including: Alcohol, nicotine or tobacco, and drug use. Access to firearms. Diet, exercise, and sleep habits. Work and work Statistician. Sunscreen use. Safety issues such as seatbelt and bike helmet use. Physical exam Your health care provider will check your: Height and weight. These may be used to calculate your BMI (body mass index). BMI is a measurement that tells if you are at a healthy weight. Waist circumference. This measures the distance around your waistline. This measurement also tells if you are at a healthy weight and may help predict your risk of certain diseases, such as type 2 diabetes and high blood pressure. Heart rate and blood pressure. Body temperature. Skin for abnormal spots. What immunizations do I need?  Vaccines are usually given at various ages, according to a schedule. Your health care provider will recommend vaccines for you based on your age, medical history, and lifestyle or other factors, such as travel or where you work. What tests do I need? Screening Your health care provider may recommend screening tests for certain conditions. This may include: Lipid and cholesterol levels. Diabetes screening. This is done by checking  your blood sugar (glucose) after you have not eaten for a while (fasting). Pelvic exam and Pap test. Hepatitis B test. Hepatitis C test. HIV (human immunodeficiency virus) test. STI (sexually transmitted infection) testing, if you are at risk. Lung cancer screening. Colorectal cancer screening. Mammogram. Talk with your health care provider about when you should start having regular mammograms. This may depend on whether you have a family history of breast cancer. BRCA-related cancer screening. This may be done if you have a family history of breast, ovarian, tubal, or peritoneal cancers. Bone density scan. This is done to screen for osteoporosis. Talk with your health care provider about your test results, treatment options, and if necessary, the need for more tests. Follow these instructions at home: Eating and drinking  Eat a diet that includes fresh fruits and vegetables, whole grains, lean protein, and low-fat dairy products. Take vitamin and mineral supplements as recommended by your health care provider. Do not drink alcohol if: Your health care provider tells you not to drink. You are pregnant, may be pregnant, or are planning to become pregnant. If you drink alcohol: Limit how much you have to 0-1 drink a day. Know how much alcohol is in your drink. In the [place] one drink equals one 12 oz bottle of beer (355 mL), one 5 oz glass of wine (148 mL), or one 1 oz glass of hard liquor (44 mL). Lifestyle Brush your teeth every morning and night with fluoride toothpaste. Floss one time each day. Exercise for at least 30 minutes 5 or more days each week. Do not use any products that contain nicotine or tobacco. These products include cigarettes, chewing tobacco, and vaping devices, such as e-cigarettes. If you need  help quitting, ask your health care provider. Do not use drugs. If you are sexually active, practice safe sex. Use a condom or other form of protection to prevent STIs. If you  do not wish to become pregnant, use a form of birth control. If you plan to become pregnant, see your health care provider for a prepregnancy visit. Take aspirin only as told by your health care provider. Make sure that you understand how much to take and what form to take. Work with your health care provider to find out whether it is safe and beneficial for you to take aspirin daily. Find healthy ways to manage stress, such as: Meditation, yoga, or listening to music. Journaling. Talking to a trusted person. Spending time with friends and family. Minimize exposure to UV radiation to reduce your risk of skin cancer. Safety Always wear your seat belt while driving or riding in a vehicle. Do not drive: If you have been drinking alcohol. Do not ride with someone who has been drinking. When you are tired or distracted. While texting. If you have been using any mind-altering substances or drugs. Wear a helmet and other protective equipment during sports activities. If you have firearms in your house, make sure you follow all gun safety procedures. Seek help if you have been physically or sexually abused. What's next? Visit your health care provider once a year for an annual wellness visit. Ask your health care provider how often you should have your eyes and teeth checked. Stay up to date on all vaccines. This information is not intended to replace advice given to you by your health care provider. Make sure you discuss any questions you have with your health care provider. Document Revised: 08/10/2020 Document Reviewed: 08/10/2020 Elsevier Patient Education  [place].

## 2021-10-13 NOTE — Assessment & Plan Note (Signed)
Stable. No concerns today.   Continue stool softener daily PRN.

## 2021-10-13 NOTE — Assessment & Plan Note (Signed)
Colonoscopy UTD, due 2025

## 2021-10-13 NOTE — Assessment & Plan Note (Signed)
First dose of Shingrix provided, influenza vaccine provided.  Mammogram UTD. Colonoscopy UTD, due 2025.  Discussed the importance of a healthy diet and regular exercise in order for weight loss, and to reduce the risk of further co-morbidity.  Exam stable. Labs pending.  Follow up in 1 year for repeat physical.

## 2021-11-03 DIAGNOSIS — M3501 Sicca syndrome with keratoconjunctivitis: Secondary | ICD-10-CM | POA: Diagnosis not present

## 2021-11-03 DIAGNOSIS — Z0100 Encounter for examination of eyes and vision without abnormal findings: Secondary | ICD-10-CM | POA: Diagnosis not present

## 2022-02-15 ENCOUNTER — Ambulatory Visit (INDEPENDENT_AMBULATORY_CARE_PROVIDER_SITE_OTHER): Payer: BC Managed Care – PPO

## 2022-02-15 DIAGNOSIS — Z23 Encounter for immunization: Secondary | ICD-10-CM | POA: Diagnosis not present

## 2022-08-01 IMAGING — US US EXTREM LOW VENOUS*R*
1 series · 14 of 24 positions shown · non-contrast
Comparison: None

CLINICAL DATA: RIGHT lower extremity swelling, warmth, and redness
for 2 weeks

EXAM:
RIGHT LOWER EXTREMITY VENOUS DOPPLER ULTRASOUND
TECHNIQUE: Gray-scale sonography with compression, as well as color and duplex
ultrasound, were performed to evaluate the deep venous system(s)
from the level of the common femoral vein through the popliteal and
proximal calf veins.

[Series 1: us extrem low venous*right* · 14 of 45 slices shown]
[im 1/45]
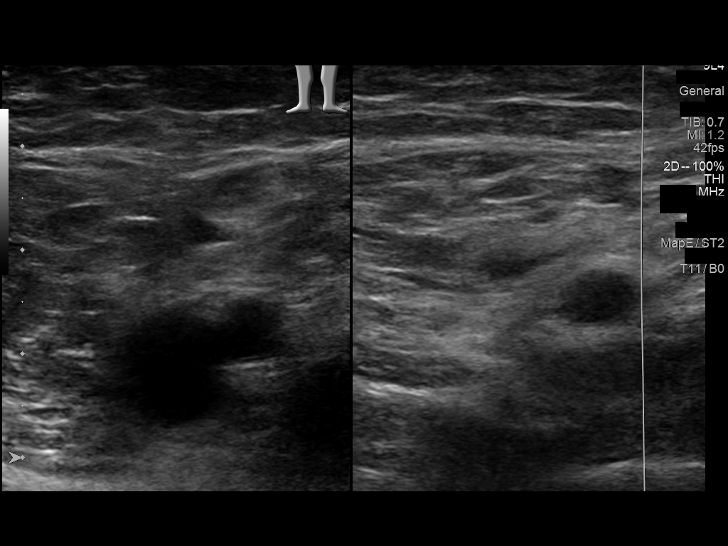
[im 4/45]
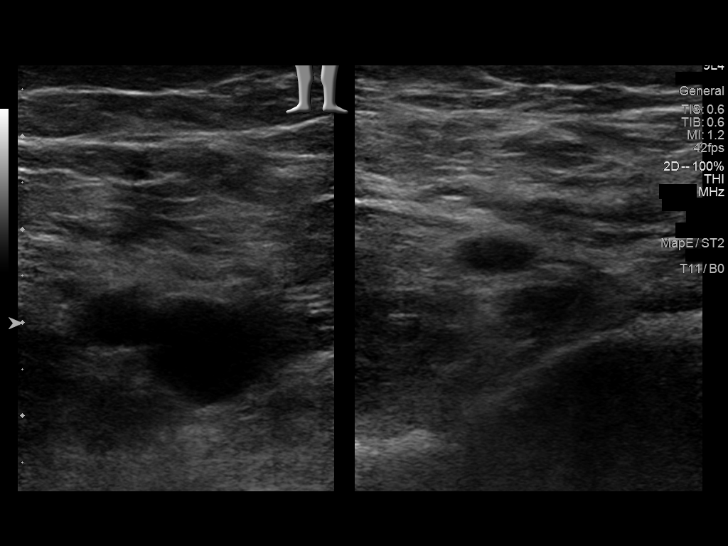
[im 8/45]
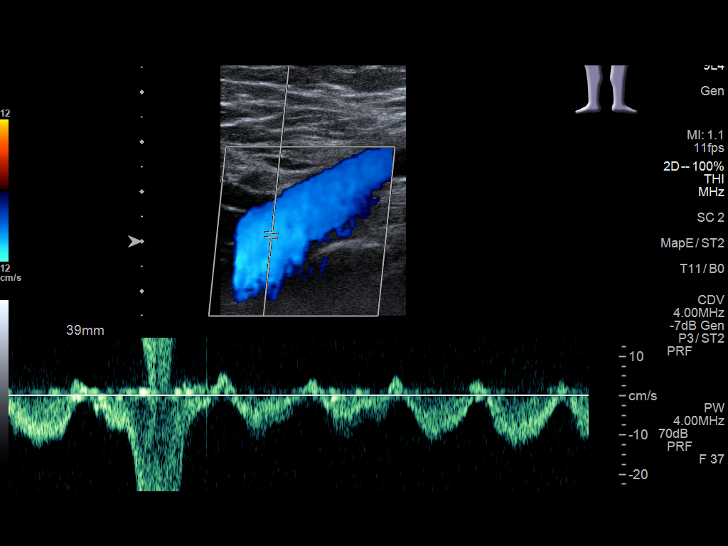
[im 12/45]
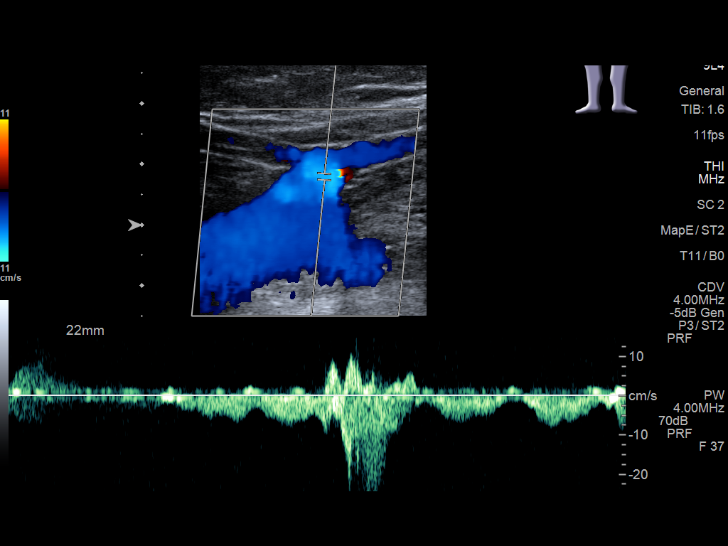
[im 14/45]
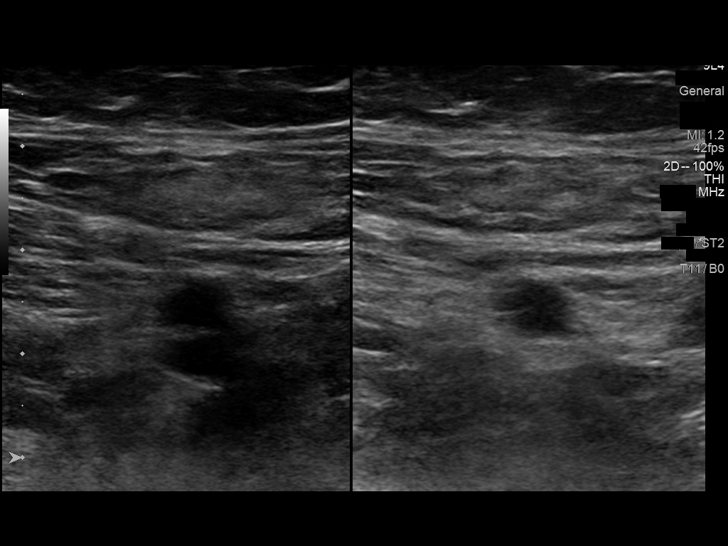
[im 18/45]
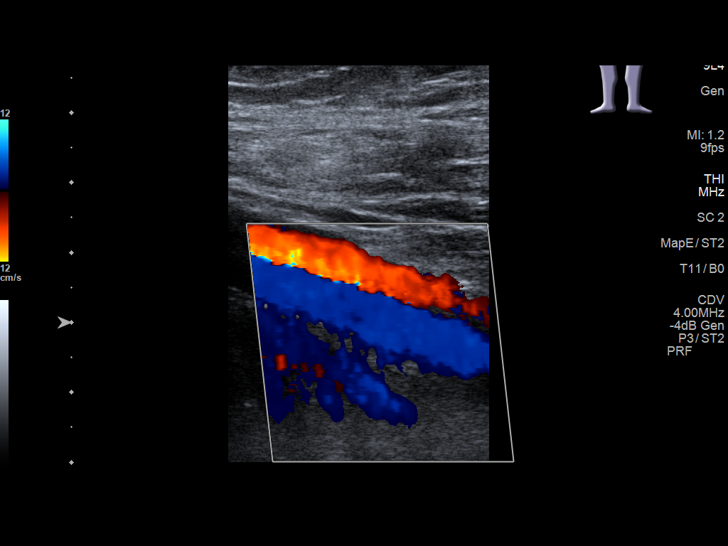
[im 22/45]
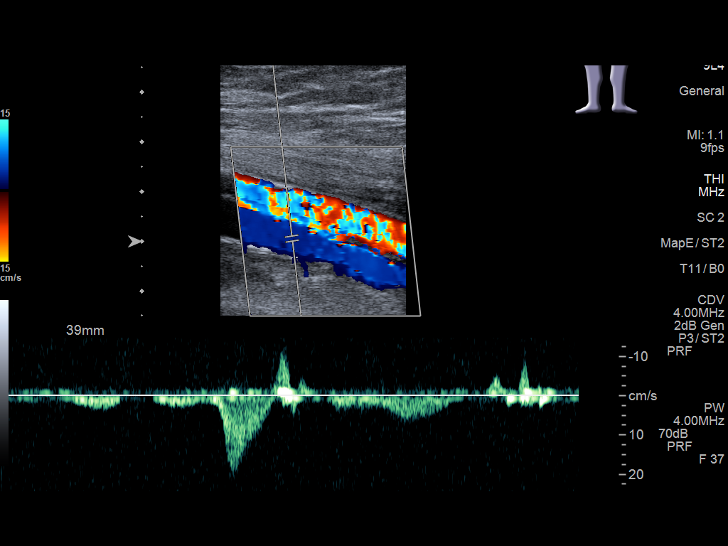
[im 23/45]
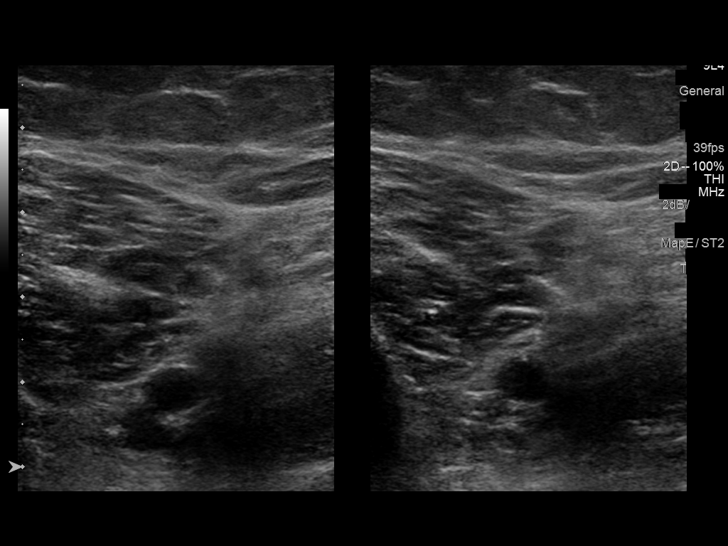
[im 27/45]
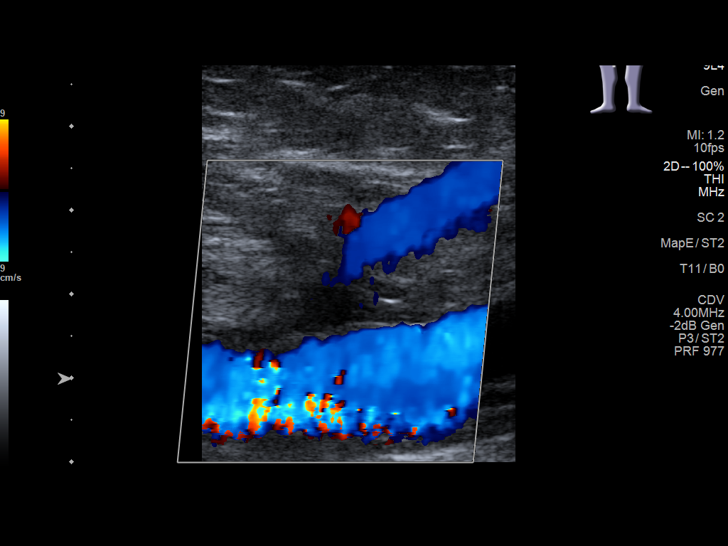
[im 31/45]
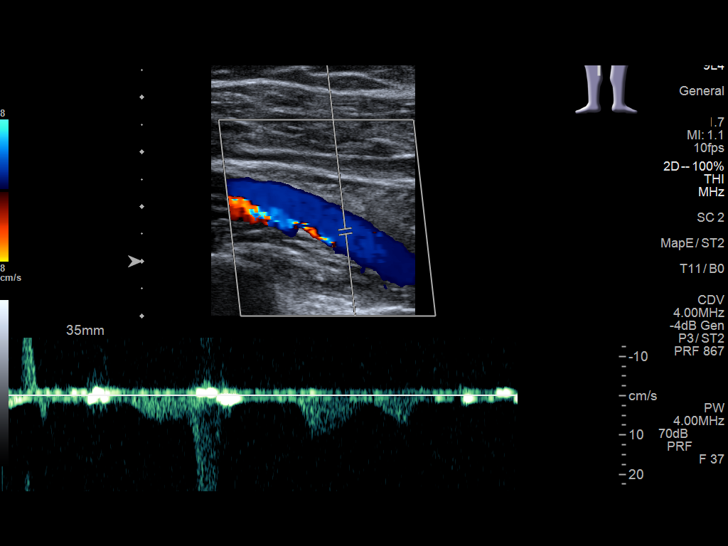
[im 35/45]
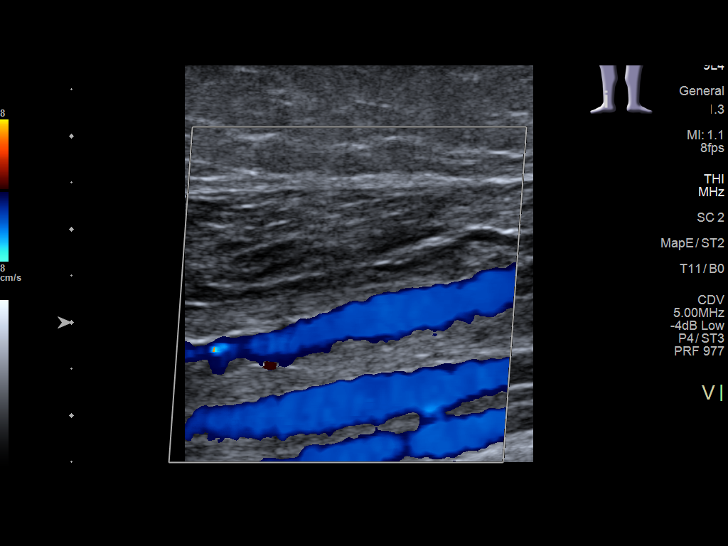
[im 37/45]
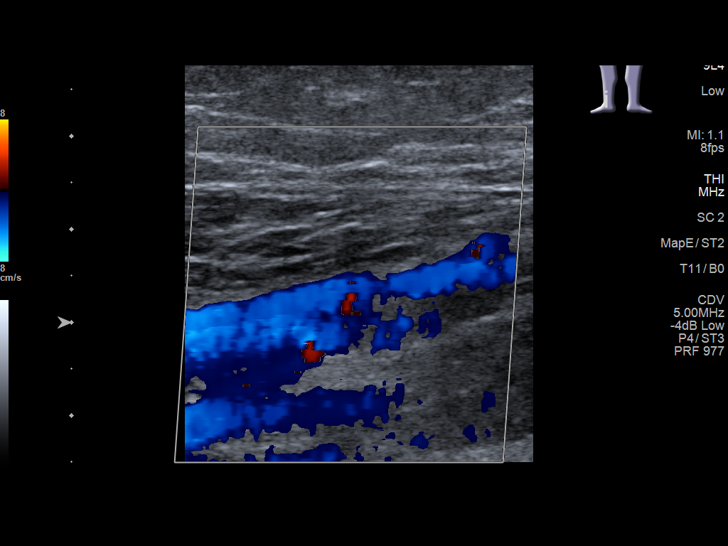
[im 41/45]
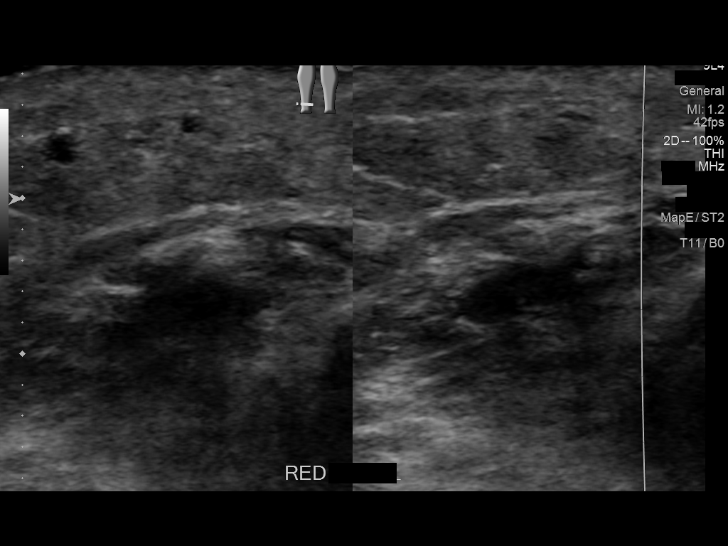
[im 45/45]
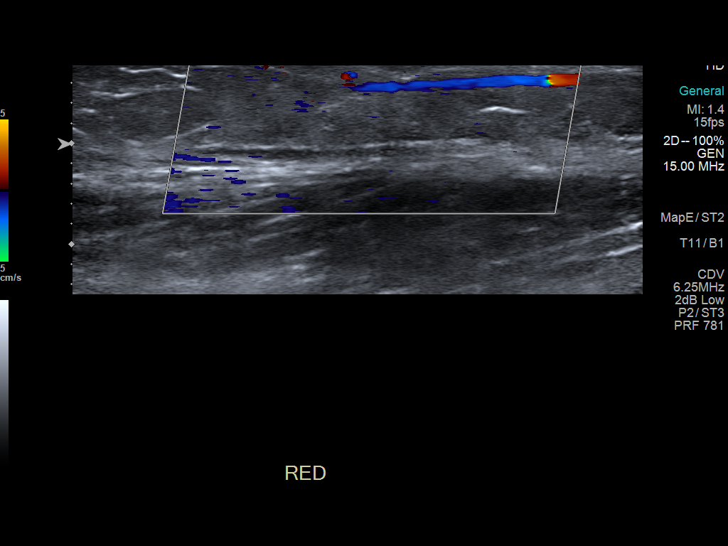

[14 of 24 positions shown; findings below may reference images not displayed]

FINDINGS: VENOUS

Normal compressibility of the common femoral, superficial femoral,
and popliteal veins, as well as the visualized calf veins.
Visualized portions of profunda femoral vein and great saphenous
vein unremarkable. No filling defects to suggest DVT on grayscale or
color Doppler imaging. Doppler waveforms show normal direction of
venous flow, normal respiratory plasticity and response to
augmentation.

Limited views of the contralateral common femoral vein are
unremarkable.

OTHER

No sonographic abnormalities are identified at the site of the
patient's symptoms.

Limitations: none
IMPRESSION: No evidence of deep venous thrombosis in the RIGHT lower extremity.

## 2022-08-27 ENCOUNTER — Other Ambulatory Visit: Payer: Self-pay | Admitting: Primary Care

## 2022-08-27 DIAGNOSIS — Z1231 Encounter for screening mammogram for malignant neoplasm of breast: Secondary | ICD-10-CM

## 2022-09-26 ENCOUNTER — Encounter (INDEPENDENT_AMBULATORY_CARE_PROVIDER_SITE_OTHER): Payer: Self-pay

## 2022-09-28 ENCOUNTER — Ambulatory Visit
Admission: RE | Admit: 2022-09-28 | Discharge: 2022-09-28 | Disposition: A | Discharge status: Home / Self Care | Payer: BC Managed Care – PPO | Source: Ambulatory Visit | Attending: Primary Care | Admitting: Primary Care

## 2022-09-28 DIAGNOSIS — Z1231 Encounter for screening mammogram for malignant neoplasm of breast: Secondary | ICD-10-CM | POA: Diagnosis not present

## 2022-10-11 ENCOUNTER — Encounter (INDEPENDENT_AMBULATORY_CARE_PROVIDER_SITE_OTHER): Payer: Self-pay

## 2022-10-19 ENCOUNTER — Encounter: Payer: BC Managed Care – PPO | Admitting: Primary Care

## 2022-11-02 ENCOUNTER — Encounter: Payer: Self-pay | Admitting: Primary Care

## 2022-11-02 ENCOUNTER — Ambulatory Visit (INDEPENDENT_AMBULATORY_CARE_PROVIDER_SITE_OTHER): Payer: BC Managed Care – PPO | Admitting: Primary Care

## 2022-11-02 VITALS — BP 120/68 | HR 62 | Temp 97.2°F | Ht 64.0 in | Wt 166.0 lb

## 2022-11-02 DIAGNOSIS — R0683 Snoring: Secondary | ICD-10-CM | POA: Insufficient documentation

## 2022-11-02 DIAGNOSIS — Z0001 Encounter for general adult medical examination with abnormal findings: Secondary | ICD-10-CM

## 2022-11-02 DIAGNOSIS — Z23 Encounter for immunization: Secondary | ICD-10-CM

## 2022-11-02 DIAGNOSIS — Z0000 Encounter for general adult medical examination without abnormal findings: Secondary | ICD-10-CM | POA: Diagnosis not present

## 2022-11-02 DIAGNOSIS — K5909 Other constipation: Secondary | ICD-10-CM | POA: Diagnosis not present

## 2022-11-02 DIAGNOSIS — R5382 Chronic fatigue, unspecified: Secondary | ICD-10-CM | POA: Diagnosis not present

## 2022-11-02 LAB — COMPREHENSIVE METABOLIC PANEL
ALT: 34 U/L (ref 0–35)
AST: 22 U/L (ref 0–37)
Albumin: 4.1 g/dL (ref 3.5–5.2)
Alkaline Phosphatase: 118 U/L — ABNORMAL HIGH (ref 39–117)
BUN: 8 mg/dL (ref 6–23)
CO2: 29 meq/L (ref 19–32)
Calcium: 9.3 mg/dL (ref 8.4–10.5)
Chloride: 103 meq/L (ref 96–112)
Creatinine, Ser: 0.63 mg/dL (ref 0.40–1.20)
GFR: 102.46 mL/min (ref >60.00)
Glucose, Bld: 85 mg/dL (ref 70–99)
Potassium: 4.2 meq/L (ref 3.5–5.1)
Sodium: 138 meq/L (ref 135–145)
Total Bilirubin: 1 mg/dL (ref 0.2–1.2)
Total Protein: 6.9 g/dL (ref 6.0–8.3)

## 2022-11-02 LAB — CBC
HCT: 43.3 % (ref 36.0–46.0)
Hemoglobin: 14.3 g/dL (ref 12.0–15.0)
MCHC: 33 g/dL (ref 30.0–36.0)
MCV: 89.6 fl (ref 78.0–100.0)
Platelets: 332 10*3/uL (ref 150.0–400.0)
RBC: 4.83 Mil/uL (ref 3.87–5.11)
RDW: 13.1 % (ref 11.5–15.5)
WBC: 8 10*3/uL (ref 4.0–10.5)

## 2022-11-02 LAB — LIPID PANEL
Cholesterol: 173 mg/dL (ref 0–200)
HDL: 43.3 mg/dL (ref >39.00)
LDL Cholesterol: 113 mg/dL — ABNORMAL HIGH (ref 0–99)
NonHDL: 129.47
Total CHOL/HDL Ratio: 4
Triglycerides: 84 mg/dL (ref 0.0–149.0)
VLDL: 16.8 mg/dL (ref 0.0–40.0)

## 2022-11-02 LAB — VITAMIN B12: Vitamin B-12: 282 pg/mL (ref 211–911)

## 2022-11-02 LAB — VITAMIN D 25 HYDROXY (VIT D DEFICIENCY, FRACTURES): VITD: 25.12 ng/mL — ABNORMAL LOW (ref 30.00–100.00)

## 2022-11-02 LAB — HEMOGLOBIN A1C: Hgb A1c MFr Bld: 5.6 % (ref 4.6–6.5)

## 2022-11-02 LAB — TSH: TSH: 1.11 u[IU]/mL (ref 0.35–5.50)

## 2022-11-02 NOTE — Patient Instructions (Signed)
Stop by the lab prior to leaving today. I will notify you of your results once received.   It was a pleasure to see you today!  

## 2022-11-02 NOTE — Assessment & Plan Note (Signed)
Tdap and influenza vaccines given today. Mammogram UTD Colonoscopy UTD, due 2025  Discussed the importance of a healthy diet and regular exercise in order for weight loss, and to reduce the risk of further co-morbidity.  Exam stable. Labs pending.  Follow up in 1 year for repeat physical.

## 2022-11-02 NOTE — Progress Notes (Signed)
Subjective:    Patient ID: Cheryl Schmidt, female    DOB: [birth year], 51 y.o.   MRN: [id number]  HPI  Cheryl Schmidt is a very pleasant 52 y.o. female who presents today for complete physical and follow up of chronic conditions.  She would also like to discuss fatigue. Chronic for >1 year. Has little energy, little motivation to do things. She does snore during the night, wakes up several times during the night. She has "low energy" during the day, doesn't have to nap when she gets home. She gets about 7 hours of sleep nightly. She denies vaginal and rectal bleeding, depression. She taking a multivitamin. She had a partial hysterectomy.   Immunizations: -Tetanus: Completed > 10 years ago -Influenza: Completed today -Shingles: Completed Shingrix series  Diet: Fair diet.  Exercise: Walking around her neighborhood.   Eye exam: Completes annually  Dental exam: Completes semi-annually    Pap Smear: Hysterectomy Mammogram: Completed in August 2024  Colonoscopy: Completed in 2020, due 2025   BP Readings from Last 3 Encounters:  11/02/22 120/68  10/13/21 127/84  09/21/21 127/85   Wt Readings from Last 3 Encounters:  11/02/22 166 lb (75.3 kg)  10/13/21 161 lb (73 kg)  07/14/20 157 lb (71.2 kg)      Review of Systems  Constitutional:  Positive for fatigue. Negative for unexpected weight change.  HENT:  Negative for rhinorrhea.   Respiratory:  Negative for cough and shortness of breath.        Snoring   Cardiovascular:  Negative for chest pain.  Gastrointestinal:  Negative for constipation and diarrhea.  Genitourinary:  Negative for difficulty urinating.  Musculoskeletal:  Negative for arthralgias and myalgias.  Skin:  Negative for rash.  Allergic/Immunologic: Negative for environmental allergies.  Neurological:  Negative for dizziness, numbness and headaches.  Psychiatric/Behavioral:  Negative for sleep disturbance. The patient is not nervous/anxious.          Past  Medical History:  Diagnosis Date   Abnormal uterine bleeding due to leiomyoma of uterus    Family history of adverse reaction to anesthesia    mother has N/V   Fibroids    Headache    Intramural leiomyoma of uterus 06/10/2016   Localized swelling of right lower extremity 05/20/2020   Melanoma (HCC) 2001   upper abdomen Dr Gwen Pounds removed   Menorrhagia with regular cycle    Occult blood in stools    Ovarian cyst    S/P hysterectomy 07/15/2019   Uterine bleeding    Uterine leiomyoma     Social History   Socioeconomic History   Marital status: Married    Spouse name: Judie Grieve   Number of children: 2   Years of education: Not on file   Highest education level: Not on file  Occupational History   Occupation: Advertising account planner  Tobacco Use   Smoking status: Never   Smokeless tobacco: Never  Vaping Use   Vaping status: Never Used  Substance and Sexual Activity   Alcohol use: Yes    Comment: 1 glass of wine/month   Drug use: Never   Sexual activity: Yes    Partners: Male    Birth control/protection: Surgical    Comment: vasectomy  Other Topics Concern   Not on file  Social History Narrative   Married.   2 children.   Works in Community education officer.   Enjoys spending time with family, walking, exercising.   Social Determinants of Health   Financial Resource Strain: Not on  file  Food Insecurity: Not on file  Transportation Needs: Not on file  Physical Activity: Insufficiently Active (09/23/2018)   Exercise Vital Sign    Days of Exercise per Week: 2 days    Minutes of Exercise per Session: 60 min  Stress: No Stress Concern Present (09/23/2018)   [employer] of Occupational Health - Occupational Stress Questionnaire    Feeling of Stress : Only a little  Social Connections: Not on file  Intimate Partner Violence: Not on file    Past Surgical History:  Procedure Laterality Date   COLONOSCOPY WITH PROPOFOL N/A 10/10/2018   Procedure: COLONOSCOPY WITH PROPOFOL;  Surgeon: Toney Reil, MD;  Location: [facility] ENDOSCOPY;  Service: Gastroenterology;  Laterality: N/A;   CYSTOSCOPY Bilateral 07/15/2019   Procedure: CYSTOSCOPY Ureteral injection of ICG for the purpose of ureteral identification ;  Surgeon: Vanna Scotland, MD;  Location: [facility] ORS;  Service: Urology;  Laterality: Bilateral;   CYSTOSCOPY W/ URETERAL STENT PLACEMENT Left 07/15/2019   Procedure: CYSTOSCOPY WITH RETROGRADE PYELOGRAM/URETERAL STENT PLACEMENT;  Surgeon: Vanna Scotland, MD;  Location: [facility] ORS;  Service: Urology;  Laterality: Left;   Excision of melanoma  2001   INTRAUTERINE DEVICE (IUD) INSERTION  2009   IUD REMOVAL  2014   ROBOTIC ASSISTED TOTAL HYSTERECTOMY WITH BILATERAL SALPINGO OOPHERECTOMY Bilateral 07/15/2019   Procedure: XI ROBOTIC ASSISTED TOTAL HYSTERECTOMY WITH BILATERAL SALPINGECTOMY;  Surgeon: Natale Milch, MD;  Location: [facility] ORS;  Service: Gynecology;  Laterality: Bilateral;   WISDOM TOOTH EXTRACTION      Family History  Problem Relation Age of Onset   Hypertension Father    Diabetes Father    Hyperlipidemia Brother    Hypertension Brother    Colon cancer Paternal Grandfather 69   Colon cancer Paternal Uncle 43   Breast cancer Neg Hx     Allergies  Allergen Reactions   Promethazine Other (See Comments)    uncontrollable reflex    Current Outpatient Medications on File Prior to Visit  Medication Sig Dispense Refill   acetaminophen (TYLENOL) 500 MG tablet Take 2 tablets (1,000 mg total) by mouth every 6 (six) hours. 30 tablet 0   ibuprofen (ADVIL) 600 MG tablet Take 1 tablet (600 mg total) by mouth every 6 (six) hours as needed for fever or headache. 30 tablet 0   Multiple Vitamin (MULTIVITAMIN WITH MINERALS) TABS tablet Take 1 tablet by mouth daily.     No current facility-administered medications on file prior to visit.    BP 120/68   Pulse 62   Temp (!) 97.2 F (36.2 C) (Temporal)   Ht 5\' 4"  (1.626 m)   Wt 166 lb (75.3 kg)   LMP 06/06/2019  (Approximate)   SpO2 99%   BMI 28.49 kg/m  Objective:   Physical Exam HENT:     Right Ear: Tympanic membrane and ear canal normal.     Left Ear: Tympanic membrane and ear canal normal.     Nose: Nose normal.  Eyes:     Conjunctiva/sclera: Conjunctivae normal.     Pupils: Pupils are equal, round, and reactive to light.  Neck:     Thyroid: No thyromegaly.  Cardiovascular:     Rate and Rhythm: Normal rate and regular rhythm.     Heart sounds: No murmur heard. Pulmonary:     Effort: Pulmonary effort is normal.     Breath sounds: Normal breath sounds. No rales.  Abdominal:     General: Bowel sounds are normal.     Palpations:  Abdomen is soft.     Tenderness: There is no abdominal tenderness.  Musculoskeletal:        General: Normal range of motion.     Cervical back: Neck supple.  Lymphadenopathy:     Cervical: No cervical adenopathy.  Skin:    General: Skin is warm and dry.     Findings: No rash.  Neurological:     Mental Status: She is alert and oriented to person, place, and time.     Cranial Nerves: No cranial nerve deficit.     Deep Tendon Reflexes: Reflexes are normal and symmetric.  Psychiatric:        Mood and Affect: Mood normal.           Assessment & Plan:  Encounter for annual general medical examination with abnormal findings in adult Assessment & Plan: Tdap and influenza vaccines given today. Mammogram UTD Colonoscopy UTD, due 2025  Discussed the importance of a healthy diet and regular exercise in order for weight loss, and to reduce the risk of further co-morbidity.  Exam stable. Labs pending.  Follow up in 1 year for repeat physical.   Orders: -     Lipid panel  Chronic constipation Assessment & Plan: Controlled.  Continue to monitor.    Chronic fatigue Assessment & Plan: Differentials include sleep apnea, vitamin deficiency, anemia, depression.  Checking labs today including CBC, vitamin B12, vitamin D, TSH. Epworth Sleepiness  Scale score of 5 today.  Await results.  Consider sleep study if labs are unremarkable.  Orders: -     Lipid panel -     Hemoglobin A1c -     Comprehensive metabolic panel -     CBC -     TSH -     Vitamin B12 -     VITAMIN D 25 Hydroxy (Vit-D Deficiency, Fractures)  Snoring Assessment & Plan: With chronic fatigue.  Epworth Sleepiness Scale score of 5 today. Checking labs today to rule out other causes for fatigue.  Consider sleep study if labs unremarkable.         Doreene Nest, NP

## 2022-11-02 NOTE — Assessment & Plan Note (Signed)
Controlled.  Continue to monitor.  

## 2022-11-02 NOTE — Assessment & Plan Note (Signed)
With chronic fatigue.  Epworth Sleepiness Scale score of 5 today. Checking labs today to rule out other causes for fatigue.  Consider sleep study if labs unremarkable.

## 2022-11-02 NOTE — Assessment & Plan Note (Addendum)
Differentials include sleep apnea, vitamin deficiency, anemia, depression.  Checking labs today including CBC, vitamin B12, vitamin D, TSH. Epworth Sleepiness Scale score of 5 today.  Await results.  Consider sleep study if labs are unremarkable.

## 2022-12-02 IMAGING — MG MM DIGITAL SCREENING BILAT W/ TOMO AND CAD
8 series · 9 of 24 positions shown · non-contrast
Comparison: Previous exam(s).

CLINICAL DATA: Screening.

EXAM:
DIGITAL SCREENING BILATERAL MAMMOGRAM WITH TOMOSYNTHESIS AND CAD
TECHNIQUE: Bilateral screening digital craniocaudal and mediolateral oblique
mammograms were obtained. Bilateral screening digital breast
tomosynthesis was performed. The images were evaluated with
computer-aided detection.

[L MLO synth-2D]
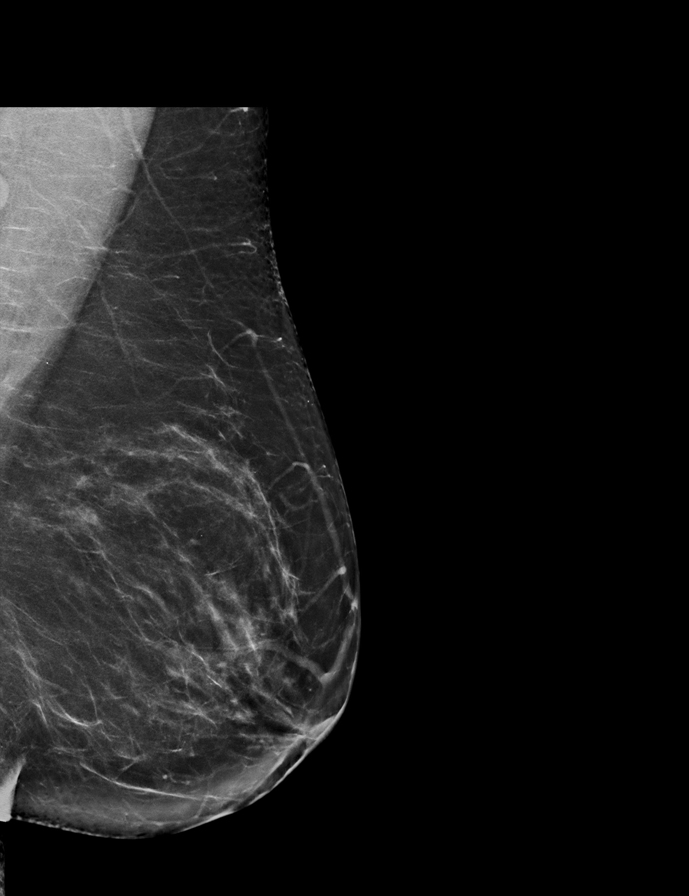

[L CC synth-2D]
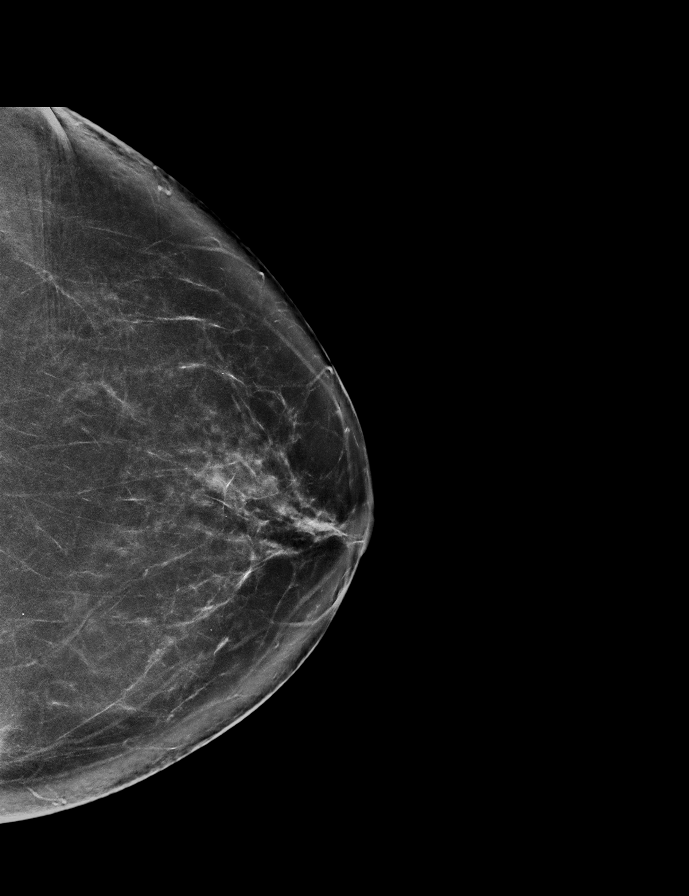

[R MLO synth-2D]
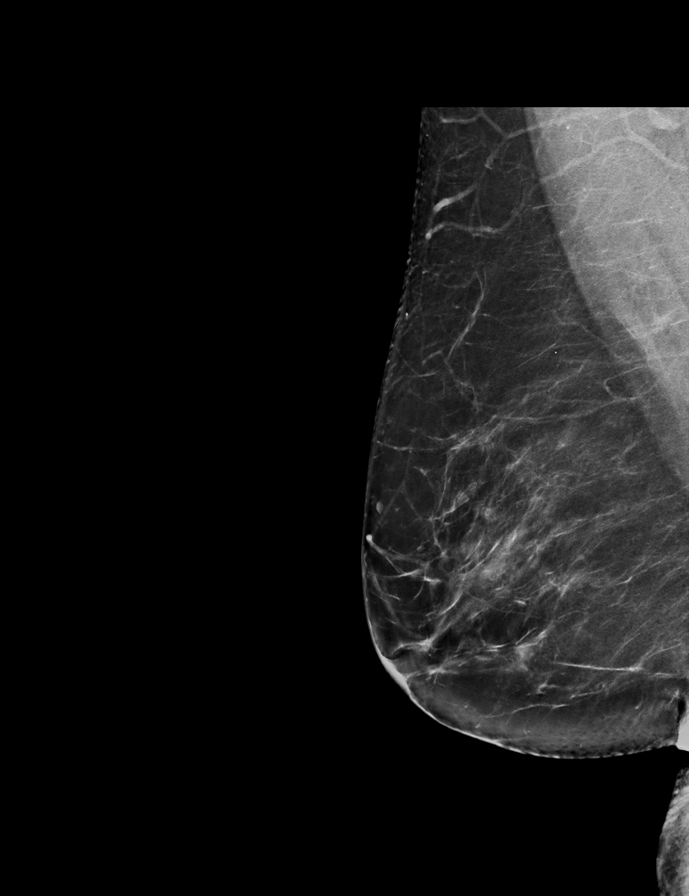

[R CC synth-2D]
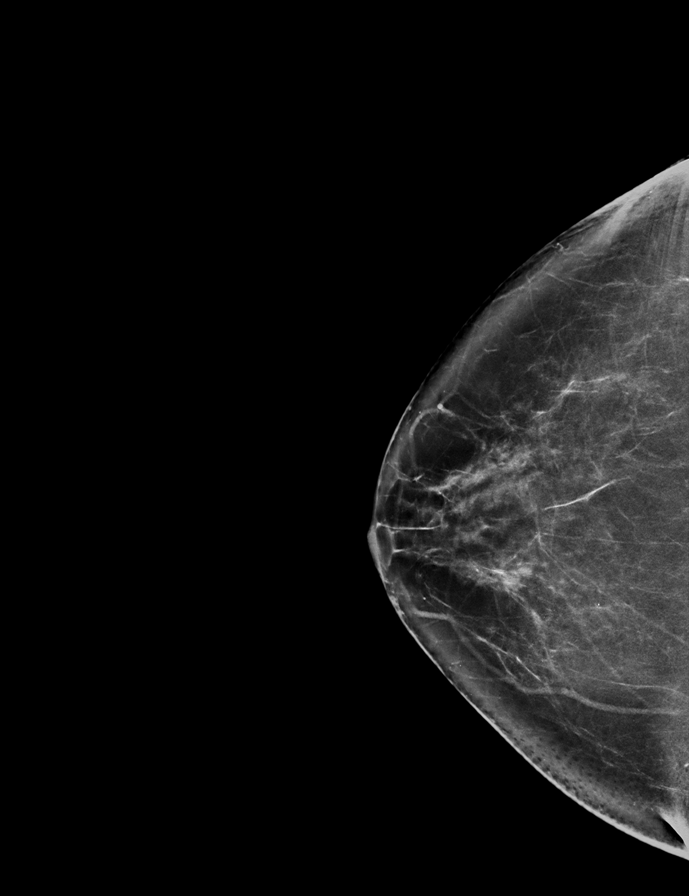

[L MLO tomo · 2 of 77 frames shown]
[frame 25/77]
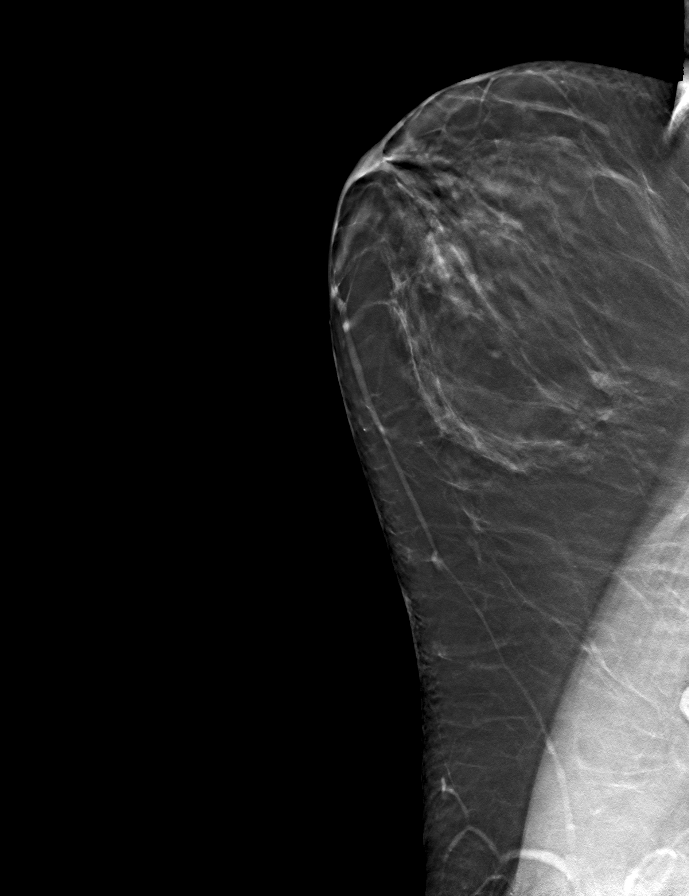
[frame 39/77]
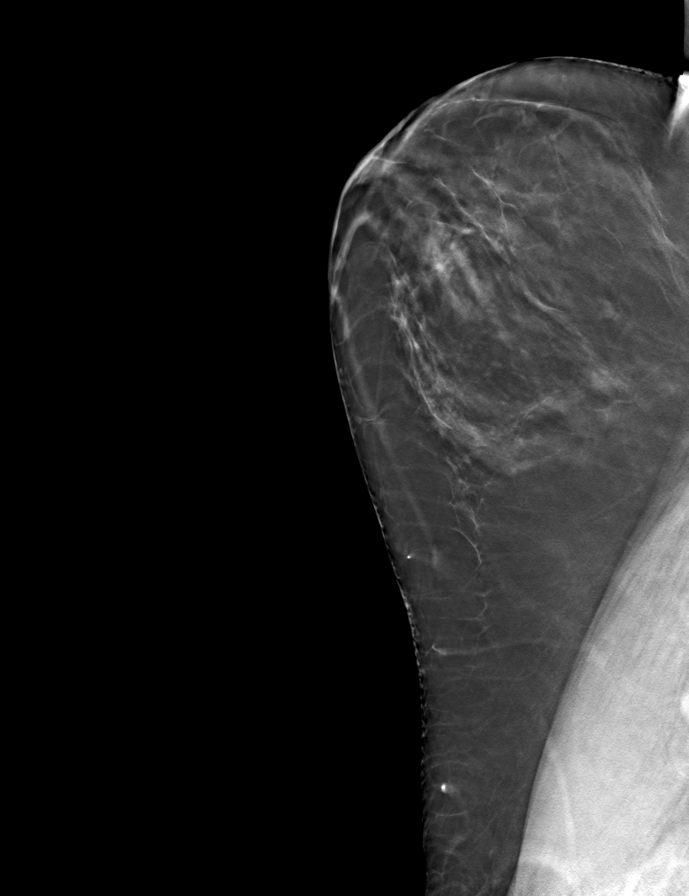

[R CC tomo · tomo slice 37/73.0]
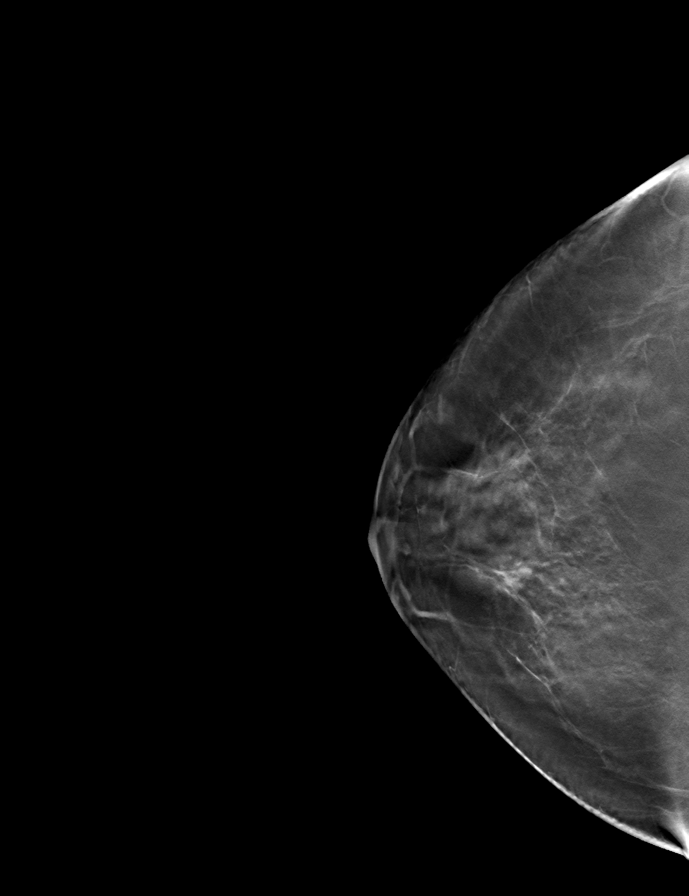

[L CC tomo · tomo slice 39/77.0]
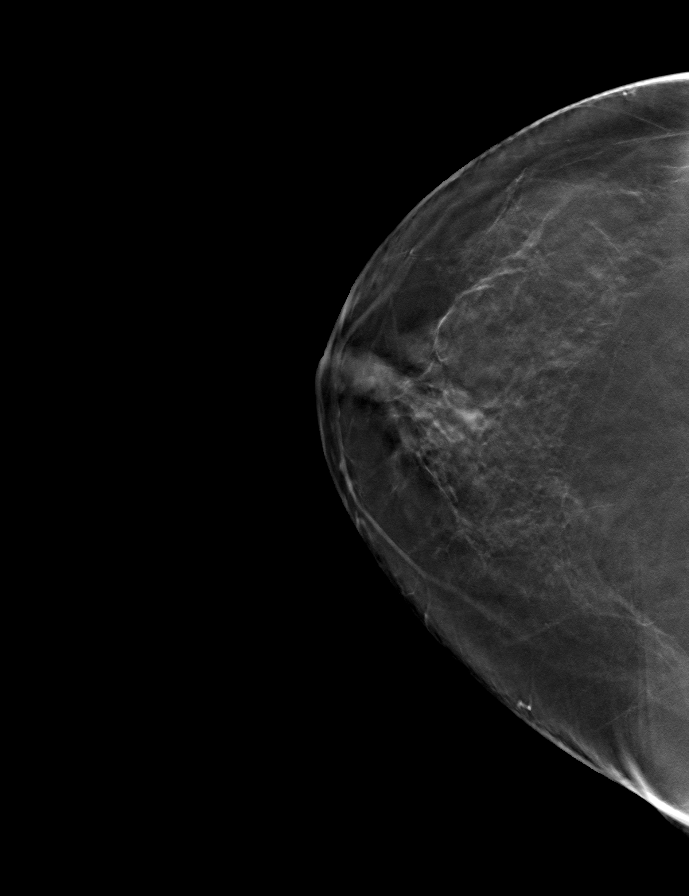

[R MLO tomo · tomo slice 39/77.0]
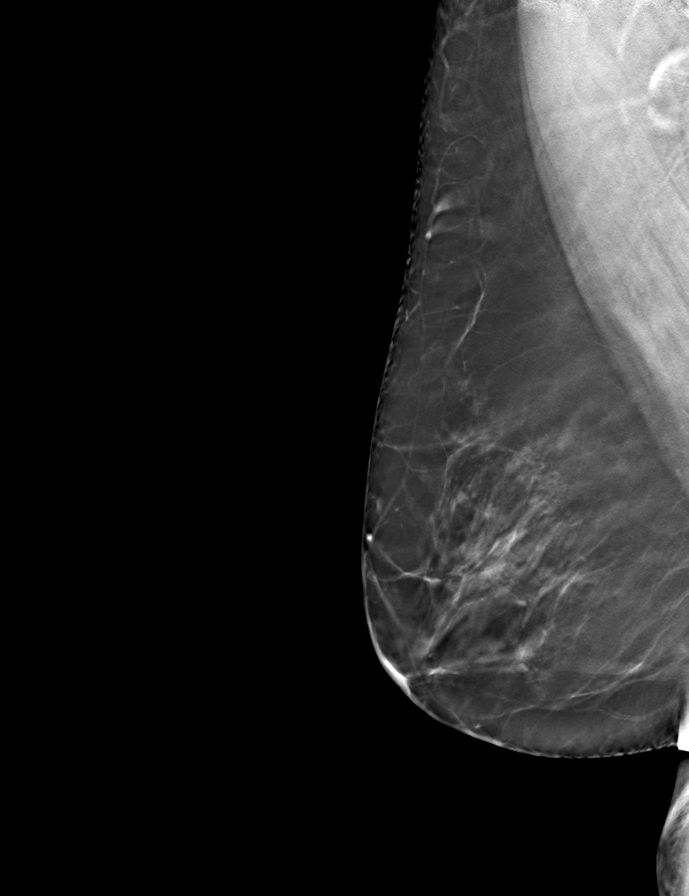

[9 of 24 positions shown; findings below may reference images not displayed]

ACR Breast Density Category b: There are scattered areas of
fibroglandular density.
FINDINGS: There are no findings suspicious for malignancy.
IMPRESSION: No mammographic evidence of malignancy. A result letter of this
screening mammogram will be mailed directly to the patient.

RECOMMENDATION:
Screening mammogram in one year. (Code:51-O-LD2)

BI-RADS CATEGORY  1: Negative.

## 2023-07-29 NOTE — Progress Notes (Signed)
 PCP: Gabriel John, NP   Chief Complaint  Patient presents with   Gynecologic Exam    No concerns    HPI:      [name] is a 53 y.o. [id number] whose LMP was Patient's last menstrual period was 06/06/2019 (approximate)., presents today for her NP> 3 yrs annual examination.  Her menses are asbsent due to s/p robotic hysterectomy with bilateral salpingectomy, cystoscopy with left ureteral stent placement for fibroids 5/21 with Dr. Suzann Ernst. No PMB. Does have vasomotor sx, wax and wane.   Sex activity: single partner, contraception - vasectomy. She does not have vaginal dryness/pain/bleeding.  Last Pap: 09/23/18 Results were: no abnormalities /neg HPV DNA 2018; no longer indicated  Last mammogram: 09/28/22  Results were: normal--routine follow-up in 12 months There is a FH of breast cancer in her mother, genetic testing not indicated. FH colon cancer on pat side, genetic testing not indicated. There is no FH of ovarian cancer. The patient does do self-breast exams.  Colonoscopy: 09/2018 with Dr. Baldomero Bone with polyps;  Repeat due after 5 years.   Tobacco use: The patient denies current or previous tobacco use. Alcohol use: social No drug use Exercise: min active  She does get adequate calcium and Vitamin D  in her diet.  Labs with PCP.   Patient Active Problem List   Diagnosis Date Noted   Chronic fatigue 11/02/2022   Snoring 11/02/2022   Encounter for annual general medical examination with abnormal findings in adult 07/14/2020   S/P total hysterectomy 07/15/2019   Family history of colon cancer    Chronic constipation 11/12/2016    Past Surgical History:  Procedure Laterality Date   COLONOSCOPY WITH PROPOFOL  N/A 10/10/2018   Procedure: COLONOSCOPY WITH PROPOFOL ;  Surgeon: Selena Daily, MD;  Location: [facility] ENDOSCOPY;  Service: Gastroenterology;  Laterality: N/A;   CYSTOSCOPY Bilateral 07/15/2019   Procedure: CYSTOSCOPY Ureteral injection of ICG for the purpose of  ureteral identification ;  Surgeon: Dustin Gimenez, MD;  Location: [facility] ORS;  Service: Urology;  Laterality: Bilateral;   CYSTOSCOPY W/ URETERAL STENT PLACEMENT Left 07/15/2019   Procedure: CYSTOSCOPY WITH RETROGRADE PYELOGRAM/URETERAL STENT PLACEMENT;  Surgeon: Dustin Gimenez, MD;  Location: [facility] ORS;  Service: Urology;  Laterality: Left;   Excision of melanoma  2001   INTRAUTERINE DEVICE (IUD) INSERTION  2009   IUD REMOVAL  2014   ROBOTIC ASSISTED TOTAL HYSTERECTOMY WITH BILATERAL SALPINGO OOPHERECTOMY Bilateral 07/15/2019   Procedure: XI ROBOTIC ASSISTED TOTAL HYSTERECTOMY WITH BILATERAL SALPINGECTOMY;  Surgeon: Heron Lord, MD;  Location: [facility] ORS;  Service: Gynecology;  Laterality: Bilateral;   WISDOM TOOTH EXTRACTION      Family History  Problem Relation Age of Onset   Breast cancer Mother 21   Hypertension Father    Diabetes Father    Hyperlipidemia Brother    Hypertension Brother    Colon cancer Paternal Uncle 43   Colon cancer Paternal Grandfather 39    Social History   Socioeconomic History   Marital status: Married    Spouse name: Geraldean Klein   Number of children: 2   Years of education: Not on file   Highest education level: Not on file  Occupational History   Occupation: Advertising account planner  Tobacco Use   Smoking status: Never   Smokeless tobacco: Never  Vaping Use   Vaping status: Never Used  Substance and Sexual Activity   Alcohol use: Yes    Comment: 1 glass of wine/month   Drug use: Never  Sexual activity: Yes    Partners: Male    Birth control/protection: Surgical    Comment: vasectomy  Other Topics Concern   Not on file  Social History Narrative   Married.   2 children.   Works in Community education officer.   Enjoys spending time with family, walking, exercising.   Social Drivers of Corporate investment banker Strain: Not on file  Food Insecurity: Not on file  Transportation Needs: Not on file  Physical Activity: Insufficiently Active (09/23/2018)    Exercise Vital Sign    Days of Exercise per Week: 2 days    Minutes of Exercise per Session: 60 min  Stress: No Stress Concern Present (09/23/2018)   [employer] of Occupational Health - Occupational Stress Questionnaire    Feeling of Stress : Only a little  Social Connections: Not on file  Intimate Partner Violence: Not on file     Current Outpatient Medications:    acetaminophen  (TYLENOL ) 500 MG tablet, Take 2 tablets (1,000 mg total) by mouth every 6 (six) hours., Disp: 30 tablet, Rfl: 0   ibuprofen  (ADVIL ) 600 MG tablet, Take 1 tablet (600 mg total) by mouth every 6 (six) hours as needed for fever or headache., Disp: 30 tablet, Rfl: 0   Multiple Vitamin (MULTIVITAMIN WITH MINERALS) TABS tablet, Take 1 tablet by mouth daily., Disp: , Rfl:      ROS:  Review of Systems  Constitutional:  Negative for fatigue, fever and unexpected weight change.  Respiratory:  Negative for cough, shortness of breath and wheezing.   Cardiovascular:  Negative for chest pain, palpitations and leg swelling.  Gastrointestinal:  Negative for blood in stool, constipation, diarrhea, nausea and vomiting.  Endocrine: Negative for cold intolerance, heat intolerance and polyuria.  Genitourinary:  Negative for dyspareunia, dysuria, flank pain, frequency, genital sores, hematuria, menstrual problem, pelvic pain, urgency, vaginal bleeding, vaginal discharge and vaginal pain.  Musculoskeletal:  Negative for back pain, joint swelling and myalgias.  Skin:  Negative for rash.  Neurological:  Negative for dizziness, syncope, light-headedness, numbness and headaches.  Hematological:  Negative for adenopathy.  Psychiatric/Behavioral:  Negative for agitation, confusion, sleep disturbance and suicidal ideas. The patient is not nervous/anxious.    BREAST: No symptoms    Objective: BP 114/72   Pulse 69   Ht 5\' 4"  (1.626 m)   Wt 168 lb (76.2 kg)   LMP 06/06/2019 (Approximate)   BMI 28.84 kg/m    Physical  Exam Constitutional:      Appearance: She is well-developed.  Genitourinary:     Vulva normal.     Genitourinary Comments: UTERUS/CX SURG REM     Right Labia: No rash, tenderness or lesions.    Left Labia: No tenderness, lesions or rash.    Vaginal cuff intact.    No vaginal discharge, erythema or tenderness.      Right Adnexa: not tender and no mass present.    Left Adnexa: not tender and no mass present.    Cervix is absent.     Uterus is absent.  Breasts:    Right: No mass, nipple discharge, skin change or tenderness.     Left: No mass, nipple discharge, skin change or tenderness.  Neck:     Thyroid : No thyromegaly.  Cardiovascular:     Rate and Rhythm: Normal rate and regular rhythm.     Heart sounds: Normal heart sounds. No murmur heard. Pulmonary:     Effort: Pulmonary effort is normal.     Breath sounds: Normal  breath sounds.  Abdominal:     Palpations: Abdomen is soft.     Tenderness: There is no abdominal tenderness. There is no guarding.  Musculoskeletal:        General: Normal range of motion.     Cervical back: Normal range of motion.  Neurological:     General: No focal deficit present.     Mental Status: She is alert and oriented to person, place, and time.     Cranial Nerves: No cranial nerve deficit.  Skin:    General: Skin is warm and dry.  Psychiatric:        Mood and Affect: Mood normal.        Behavior: Behavior normal.        Thought Content: Thought content normal.        Judgment: Judgment normal.  Vitals reviewed.    Assessment/Plan:  Encounter for annual routine gynecological examination  Encounter for screening mammogram for malignant neoplasm of breast - Plan: MM 3D SCREENING MAMMOGRAM BILATERAL BREAST; pt to schedule mammo  Screening for colon cancer--pt to schedule colonoscopy; will do ref prn.   Family history of colon cancer--genetic testing not indicated  Vasomotor symptoms--discussed HRT vs veozah vs menopause herbal supp. F/u  prn.           GYN counsel breast self exam, mammography screening, menopause, adequate intake of calcium and vitamin D , diet and exercise    F/U  Return in about 1 year (around 07/29/2024).  Brylei Pedley B. Ajaya Crutchfield, PA-C 07/30/2023 9:36 AM

## 2023-07-30 ENCOUNTER — Encounter: Payer: Self-pay | Admitting: Obstetrics and Gynecology

## 2023-07-30 ENCOUNTER — Ambulatory Visit (INDEPENDENT_AMBULATORY_CARE_PROVIDER_SITE_OTHER): Admitting: Obstetrics and Gynecology

## 2023-07-30 VITALS — BP 114/72 | HR 69 | Ht 64.0 in | Wt 168.0 lb

## 2023-07-30 DIAGNOSIS — Z800 Family history of malignant neoplasm of digestive organs: Secondary | ICD-10-CM

## 2023-07-30 DIAGNOSIS — Z01419 Encounter for gynecological examination (general) (routine) without abnormal findings: Secondary | ICD-10-CM | POA: Diagnosis not present

## 2023-07-30 DIAGNOSIS — Z1211 Encounter for screening for malignant neoplasm of colon: Secondary | ICD-10-CM

## 2023-07-30 DIAGNOSIS — Z1231 Encounter for screening mammogram for malignant neoplasm of breast: Secondary | ICD-10-CM

## 2023-07-30 NOTE — Patient Instructions (Signed)
 I value your feedback and you entrusting [place] with your care. If you get a [facility] patient survey, I would appreciate you taking the time to let us know about your experience today. Thank you!  [facility] ([place])--[phone]

## 2023-09-18 DIAGNOSIS — D225 Melanocytic nevi of trunk: Secondary | ICD-10-CM | POA: Diagnosis not present

## 2023-09-18 DIAGNOSIS — D2262 Melanocytic nevi of left upper limb, including shoulder: Secondary | ICD-10-CM | POA: Diagnosis not present

## 2023-09-18 DIAGNOSIS — D2261 Melanocytic nevi of right upper limb, including shoulder: Secondary | ICD-10-CM | POA: Diagnosis not present

## 2023-09-18 DIAGNOSIS — D2272 Melanocytic nevi of left lower limb, including hip: Secondary | ICD-10-CM | POA: Diagnosis not present

## 2023-10-04 ENCOUNTER — Ambulatory Visit
Admission: RE | Admit: 2023-10-04 | Discharge: 2023-10-04 | Disposition: A | Discharge status: Home / Self Care | Source: Ambulatory Visit | Attending: Obstetrics and Gynecology | Admitting: Obstetrics and Gynecology

## 2023-10-04 DIAGNOSIS — Z1231 Encounter for screening mammogram for malignant neoplasm of breast: Secondary | ICD-10-CM | POA: Diagnosis not present

## 2023-10-08 ENCOUNTER — Ambulatory Visit: Payer: Self-pay | Admitting: Obstetrics and Gynecology

## 2023-11-05 ENCOUNTER — Ambulatory Visit: Admitting: Primary Care

## 2023-11-05 ENCOUNTER — Encounter: Payer: Self-pay | Admitting: Primary Care

## 2023-11-05 VITALS — BP 116/74 | HR 91 | Temp 98.4°F | Ht 64.0 in | Wt 167.8 lb

## 2023-11-05 DIAGNOSIS — R7989 Other specified abnormal findings of blood chemistry: Secondary | ICD-10-CM | POA: Diagnosis not present

## 2023-11-05 DIAGNOSIS — Z0000 Encounter for general adult medical examination without abnormal findings: Secondary | ICD-10-CM

## 2023-11-05 DIAGNOSIS — Z800 Family history of malignant neoplasm of digestive organs: Secondary | ICD-10-CM | POA: Diagnosis not present

## 2023-11-05 DIAGNOSIS — E785 Hyperlipidemia, unspecified: Secondary | ICD-10-CM

## 2023-11-05 DIAGNOSIS — Z23 Encounter for immunization: Secondary | ICD-10-CM | POA: Diagnosis not present

## 2023-11-05 DIAGNOSIS — Z1211 Encounter for screening for malignant neoplasm of colon: Secondary | ICD-10-CM

## 2023-11-05 DIAGNOSIS — R5382 Chronic fatigue, unspecified: Secondary | ICD-10-CM | POA: Diagnosis not present

## 2023-11-05 DIAGNOSIS — E559 Vitamin D deficiency, unspecified: Secondary | ICD-10-CM | POA: Diagnosis not present

## 2023-11-05 NOTE — Assessment & Plan Note (Signed)
Colonoscopy due, referral placed to GI.

## 2023-11-05 NOTE — Assessment & Plan Note (Signed)
Immunizations UTD. Influenza vaccine provided today.  Mammogram UTD Colonoscopy due, referral placed to GI  Discussed the importance of a healthy diet and regular exercise in order for weight loss, and to reduce the risk of further co-morbidity.  Exam stable. Labs pending.  Follow up in 1 year for repeat physical.

## 2023-11-05 NOTE — Assessment & Plan Note (Signed)
 Improved.  Continue vitamin D  and B12 supplements.

## 2023-11-05 NOTE — Addendum Note (Signed)
 Addended by: Burwell Bethel K on: 11/05/2023 03:45 PM   Modules accepted: Level of Service

## 2023-11-05 NOTE — Patient Instructions (Signed)
 Stop by the lab prior to leaving today. I will notify you of your results once received.   It was a pleasure to see you today!

## 2023-11-05 NOTE — Assessment & Plan Note (Signed)
 Repeat lipid panel pending.

## 2023-11-05 NOTE — Progress Notes (Signed)
 Subjective:    Patient ID: Cheryl Schmidt, female    DOB: [birth year], 53 y.o.   MRN: [id number]  Cheryl Schmidt is a very pleasant 53 y.o. female who presents today for complete physical and follow up of chronic conditions.  Immunizations: -Tetanus: Completed in 2024 -Influenza: Influenza vaccine provided today.  -Shingles: Completed Shingrix  series   Diet: Fair diet.  Exercise: No regular exercise.  Eye exam: Completes annually  Dental exam: Completes semi-annually    Pap Smear: Hysterectomy  Mammogram: Completed in August 2025  Colonoscopy: Completed in August 2020, due August 2025   BP Readings from Last 3 Encounters:  11/05/23 116/74  07/30/23 114/72  11/02/22 120/68      Review of Systems  Constitutional:  Negative for unexpected weight change.  HENT:  Negative for rhinorrhea.   Respiratory:  Negative for cough and shortness of breath.   Cardiovascular:  Negative for chest pain.  Gastrointestinal:  Negative for constipation and diarrhea.  Genitourinary:  Negative for difficulty urinating.  Musculoskeletal:  Negative for arthralgias and myalgias.  Skin:  Negative for rash.  Allergic/Immunologic: Negative for environmental allergies.  Neurological:  Negative for dizziness and headaches.  Psychiatric/Behavioral:  The patient is not nervous/anxious.          Past Medical History:  Diagnosis Date   Abnormal uterine bleeding due to leiomyoma of uterus    Family history of adverse reaction to anesthesia    mother has N/V   Fibroids    Headache    Intramural leiomyoma of uterus 06/10/2016   Localized swelling of right lower extremity 05/20/2020   Melanoma (HCC) 2001   upper abdomen Dr Hester removed   Menorrhagia with regular cycle    Occult blood in stools    Ovarian cyst    S/P hysterectomy 07/15/2019   Uterine bleeding    Uterine leiomyoma     Social History   Socioeconomic History   Marital status: Married    Spouse name: Dorise   Number of  children: 2   Years of education: Not on file   Highest education level: Not on file  Occupational History   Occupation: Advertising account planner  Tobacco Use   Smoking status: Never   Smokeless tobacco: Never  Vaping Use   Vaping status: Never Used  Substance and Sexual Activity   Alcohol use: Yes    Comment: 1 glass of wine/month   Drug use: Never   Sexual activity: Yes    Partners: Male    Birth control/protection: Surgical    Comment: vasectomy  Other Topics Concern   Not on file  Social History Narrative   Married.   2 children.   Works in Community education officer.   Enjoys spending time with family, walking, exercising.   Social Drivers of Corporate investment banker Strain: Not on file  Food Insecurity: Not on file  Transportation Needs: Not on file  Physical Activity: Insufficiently Active (09/23/2018)   Exercise Vital Sign    Days of Exercise per Week: 2 days    Minutes of Exercise per Session: 60 min  Stress: No Stress Concern Present (09/23/2018)   [employer] of Occupational Health - Occupational Stress Questionnaire    Feeling of Stress : Only a little  Social Connections: Not on file  Intimate Partner Violence: Not on file    Past Surgical History:  Procedure Laterality Date   COLONOSCOPY WITH PROPOFOL  N/A 10/10/2018   Procedure: COLONOSCOPY WITH PROPOFOL ;  Surgeon: Unk Corinn Skiff, MD;  Location: [facility];  Service: Gastroenterology;  Laterality: N/A;   CYSTOSCOPY Bilateral 07/15/2019   Procedure: CYSTOSCOPY Ureteral injection of ICG for the purpose of ureteral identification ;  Surgeon: Penne Knee, MD;  Location: [facility];  Service: Urology;  Laterality: Bilateral;   CYSTOSCOPY W/ URETERAL STENT PLACEMENT Left 07/15/2019   Procedure: CYSTOSCOPY WITH RETROGRADE PYELOGRAM/URETERAL STENT PLACEMENT;  Surgeon: Penne Knee, MD;  Location: [facility];  Service: Urology;  Laterality: Left;   Excision of melanoma  2001   INTRAUTERINE DEVICE (IUD) INSERTION  2009    IUD REMOVAL  2014   ROBOTIC ASSISTED TOTAL HYSTERECTOMY WITH BILATERAL SALPINGO OOPHERECTOMY Bilateral 07/15/2019   Procedure: XI ROBOTIC ASSISTED TOTAL HYSTERECTOMY WITH BILATERAL SALPINGECTOMY;  Surgeon: Victor Claudell SAUNDERS, MD;  Location: [facility] ORS;  Service: Gynecology;  Laterality: Bilateral;   WISDOM TOOTH EXTRACTION      Family History  Problem Relation Age of Onset   Breast cancer Mother 30   Hypertension Father    Diabetes Father    Hyperlipidemia Brother    Hypertension Brother    Colon cancer Paternal Uncle 84   Colon cancer Paternal Grandfather 61    Allergies  Allergen Reactions   Promethazine Other (See Comments)    uncontrollable reflex    Current Outpatient Medications on File Prior to Visit  Medication Sig Dispense Refill   acetaminophen  (TYLENOL ) 500 MG tablet Take 2 tablets (1,000 mg total) by mouth every 6 (six) hours. 30 tablet 0   cholecalciferol (VITAMIN D3) 25 MCG (1000 UNIT) tablet Take 1,000 Units by mouth daily.     ibuprofen  (ADVIL ) 600 MG tablet Take 1 tablet (600 mg total) by mouth every 6 (six) hours as needed for fever or headache. 30 tablet 0   Multiple Vitamin (MULTIVITAMIN WITH MINERALS) TABS tablet Take 1 tablet by mouth daily.     vitamin B-12 (CYANOCOBALAMIN ) 500 MCG tablet Take 500 mcg by mouth daily.     No current facility-administered medications on file prior to visit.    BP 116/74 (BP Location: Left Arm, Patient Position: Sitting, Cuff Size: Normal)   Pulse 91   Temp 98.4 F (36.9 C) (Temporal)   Ht 5' 4 (1.626 m)   Wt 167 lb 12.8 oz (76.1 kg)   LMP 06/06/2019 (Approximate)   SpO2 98%   BMI 28.80 kg/m  Objective:   Physical Exam HENT:     Right Ear: Tympanic membrane and ear canal normal.     Left Ear: Tympanic membrane and ear canal normal.  Eyes:     Pupils: Pupils are equal, round, and reactive to light.  Cardiovascular:     Rate and Rhythm: Normal rate and regular rhythm.  Pulmonary:     Effort: Pulmonary effort is  normal.     Breath sounds: Normal breath sounds.  Abdominal:     General: Bowel sounds are normal.     Palpations: Abdomen is soft.     Tenderness: There is no abdominal tenderness.  Musculoskeletal:        General: Normal range of motion.     Cervical back: Neck supple.  Skin:    General: Skin is warm and dry.  Neurological:     Mental Status: She is alert and oriented to person, place, and time.     Cranial Nerves: No cranial nerve deficit.     Deep Tendon Reflexes:     Reflex Scores:      Patellar reflexes are 2+ on the right side and 2+ on the  left side. Psychiatric:        Mood and Affect: Mood normal.     Physical Exam        Assessment & Plan:  Preventative health care Assessment & Plan: Immunizations UTD. Influenza vaccine provided today.  Mammogram UTD Colonoscopy due, referral placed to GI  Discussed the importance of a healthy diet and regular exercise in order for weight loss, and to reduce the risk of further co-morbidity.  Exam stable. Labs pending.  Follow up in 1 year for repeat physical.    Need for influenza vaccination -     Flu vaccine trivalent PF, 6mos and older(Flulaval,Afluria,Fluarix,Fluzone)  Family history of colon cancer Assessment & Plan: Colonoscopy due, referral placed to GI.  Orders: -     Ambulatory referral to Gastroenterology  Screening for colon cancer -     Ambulatory referral to Gastroenterology  Chronic fatigue Assessment & Plan: Improved.  Continue vitamin D  and B12 supplements.    Hyperlipidemia, unspecified hyperlipidemia type Assessment & Plan: Repeat lipid panel pending.    Orders: -     Comprehensive metabolic panel with GFR -     Lipid panel -     CBC  Vitamin D  deficiency -     VITAMIN D  25 Hydroxy (Vit-D Deficiency, Fractures)  Low vitamin B12 level -     Vitamin B12    Assessment and Plan Assessment & Plan         Comer MARLA Gaskins, NP    History of Present Illness

## 2023-11-06 ENCOUNTER — Ambulatory Visit: Payer: Self-pay | Admitting: Primary Care

## 2023-11-06 LAB — COMPREHENSIVE METABOLIC PANEL WITH GFR
ALT: 30 U/L (ref 0–35)
AST: 20 U/L (ref 0–37)
Albumin: 4.3 g/dL (ref 3.5–5.2)
Alkaline Phosphatase: 113 U/L (ref 39–117)
BUN: 9 mg/dL (ref 6–23)
CO2: 27 meq/L (ref 19–32)
Calcium: 9.1 mg/dL (ref 8.4–10.5)
Chloride: 102 meq/L (ref 96–112)
Creatinine, Ser: 0.73 mg/dL (ref 0.40–1.20)
GFR: 94.31 mL/min (ref >60.00)
Glucose, Bld: 84 mg/dL (ref 70–99)
Potassium: 4.1 meq/L (ref 3.5–5.1)
Sodium: 137 meq/L (ref 135–145)
Total Bilirubin: 0.8 mg/dL (ref 0.2–1.2)
Total Protein: 7.1 g/dL (ref 6.0–8.3)

## 2023-11-06 LAB — CBC
HCT: 40.9 % (ref 36.0–46.0)
Hemoglobin: 13.9 g/dL (ref 12.0–15.0)
MCHC: 34.1 g/dL (ref 30.0–36.0)
MCV: 87 fl (ref 78.0–100.0)
Platelets: 314 K/uL (ref 150.0–400.0)
RBC: 4.7 Mil/uL (ref 3.87–5.11)
RDW: 12.9 % (ref 11.5–15.5)
WBC: 9.2 K/uL (ref 4.0–10.5)

## 2023-11-06 LAB — LIPID PANEL
Cholesterol: 170 mg/dL (ref 0–200)
HDL: 39.2 mg/dL (ref >39.00)
LDL Cholesterol: 100 mg/dL — ABNORMAL HIGH (ref 0–99)
NonHDL: 131.27
Total CHOL/HDL Ratio: 4
Triglycerides: 156 mg/dL — ABNORMAL HIGH (ref 0.0–149.0)
VLDL: 31.2 mg/dL (ref 0.0–40.0)

## 2023-11-06 LAB — VITAMIN D 25 HYDROXY (VIT D DEFICIENCY, FRACTURES): VITD: 32.09 ng/mL (ref 30.00–100.00)

## 2023-11-06 LAB — VITAMIN B12: Vitamin B-12: 553 pg/mL (ref 211–911)

## 2023-12-25 NOTE — Telephone Encounter (Signed)
 Pt is scheduled with [facility] - Dr Unk on 02/24/24  Nothing further needed.

## 2024-03-20 ENCOUNTER — Encounter: Payer: Self-pay | Admitting: Gastroenterology

## 2024-03-20 ENCOUNTER — Ambulatory Visit: Admitting: Anesthesiology

## 2024-03-20 ENCOUNTER — Ambulatory Visit
Admission: RE | Admit: 2024-03-20 | Discharge: 2024-03-20 | Disposition: A | Discharge status: Home / Self Care | Attending: Gastroenterology | Admitting: Gastroenterology

## 2024-03-20 ENCOUNTER — Encounter
Admission: RE | Disposition: A | Discharge status: Home / Self Care | Payer: Self-pay | Source: Home / Self Care | Attending: Gastroenterology

## 2024-03-20 DIAGNOSIS — Z800 Family history of malignant neoplasm of digestive organs: Secondary | ICD-10-CM | POA: Insufficient documentation

## 2024-03-20 DIAGNOSIS — Z860101 Personal history of adenomatous and serrated colon polyps: Secondary | ICD-10-CM | POA: Insufficient documentation

## 2024-03-20 DIAGNOSIS — Z1211 Encounter for screening for malignant neoplasm of colon: Secondary | ICD-10-CM | POA: Insufficient documentation

## 2024-03-20 MED ORDER — SODIUM CHLORIDE 0.9 % IV SOLN
INTRAVENOUS | Status: DC
  Administered 2024-03-20: 20 mL/h via INTRAVENOUS

## 2024-03-20 MED ORDER — LIDOCAINE HCL (CARDIAC) PF 100 MG/5ML IV SOSY
PREFILLED_SYRINGE | INTRAVENOUS | Status: DC | PRN
  Administered 2024-03-20: 100 mg via INTRAVENOUS

## 2024-03-20 MED ORDER — PROPOFOL 500 MG/50ML IV EMUL
INTRAVENOUS | Status: DC | PRN
  Administered 2024-03-20: 80 mg via INTRAVENOUS
  Administered 2024-03-20: 140 ug/kg/min via INTRAVENOUS

## 2024-03-20 MED ORDER — DEXMEDETOMIDINE HCL IN NACL 200 MCG/50ML IV SOLN
INTRAVENOUS | Status: DC | PRN
  Administered 2024-03-20: 4 ug via INTRAVENOUS

## 2024-03-20 NOTE — Anesthesia Preprocedure Evaluation (Signed)
"                                    Anesthesia Evaluation  Patient identified by MRN, date of birth, ID band Patient awake    Reviewed: Allergy & Precautions, NPO status , Patient's Chart, lab work & pertinent test results  History of Anesthesia Complications Negative for: history of anesthetic complications  Airway Mallampati: II       Dental   Pulmonary neg sleep apnea, neg COPD          Cardiovascular (-) hypertension(-) Past MI and (-) CHF (-) dysrhythmias (-) Valvular Problems/Murmurs     Neuro/Psych neg Seizures    GI/Hepatic Neg liver ROS,neg GERD  ,,  Endo/Other  neg diabetes    Renal/GU negative Renal ROS     Musculoskeletal   Abdominal   Peds  Hematology   Anesthesia Other Findings   Reproductive/Obstetrics                              Anesthesia Physical Anesthesia Plan  ASA: 1  Anesthesia Plan: General   Post-op Pain Management:    Induction: Intravenous  PONV Risk Score and Plan: 3 and TIVA and Propofol  infusion  Airway Management Planned:   Additional Equipment:   Intra-op Plan:   Post-operative Plan:   Informed Consent: I have reviewed the patients History and Physical, chart, labs and discussed the procedure including the risks, benefits and alternatives for the proposed anesthesia with the patient or authorized representative who has indicated his/her understanding and acceptance.       Plan Discussed with: CRNA  Anesthesia Plan Comments:          Anesthesia Quick Evaluation  "

## 2024-03-20 NOTE — Op Note (Signed)
 [facility] Patient Name: Cheryl Schmidt Procedure Date: 03/20/2024 10:57 AM MRN: [id number] Account #: [id number] Date of Birth: 12/13/1970 Admit Type: Outpatient Age: 54 Room: [facility] ENDO ROOM 4 Gender: Female Note Status: Finalized Instrument Name: Colon Scope [id number] Procedure:             Colonoscopy Indications:           Surveillance: Personal history of adenomatous polyps                         on last colonoscopy 5 years ago, Last colonoscopy:                         August 2020 Providers:             Corinn Jess Brooklyn MD, MD Referring MD:          Comer LOIS Gaskins (Referring MD) Medicines:             General Anesthesia Complications:         No immediate complications. Estimated blood loss: None. Procedure:             Pre-Anesthesia Assessment:                        - Prior to the procedure, a History and Physical was                         performed, and patient medications and allergies were                         reviewed. The patient is competent. The risks and                         benefits of the procedure and the sedation options and                         risks were discussed with the patient. All questions                         were answered and informed consent was obtained.                         Patient identification and proposed procedure were                         verified by the physician, the nurse, the                         anesthesiologist, the anesthetist and the technician                         in the pre-procedure area in the procedure room in the                         endoscopy suite. Mental Status Examination: alert and                         oriented. Airway Examination: normal oropharyngeal  airway and neck mobility. Respiratory Examination:                         clear to auscultation. CV Examination: normal.                         Prophylactic Antibiotics: The  patient does not require                         prophylactic antibiotics. Prior Anticoagulants: The                         patient has taken no anticoagulant or antiplatelet                         agents. ASA Grade Assessment: I - A normal, healthy                         patient. After reviewing the risks and benefits, the                         patient was deemed in satisfactory condition to                         undergo the procedure. The anesthesia plan was to use                         general anesthesia. Immediately prior to                         administration of medications, the patient was                         re-assessed for adequacy to receive sedatives. The                         heart rate, respiratory rate, oxygen saturations,                         blood pressure, adequacy of pulmonary ventilation, and                         response to care were monitored throughout the                         procedure. The physical status of the patient was                         re-assessed after the procedure.                        After obtaining informed consent, the colonoscope was                         passed under direct vision. Throughout the procedure,                         the patient's blood pressure, pulse, and oxygen  saturations were monitored continuously. The was                         introduced through the anus and advanced to the the                         terminal ileum, with identification of the appendiceal                         orifice and IC valve. The colonoscopy was performed                         without difficulty. The patient tolerated the                         procedure well. The quality of the bowel preparation                         was evaluated using the BBPS [facility] Bowel Preparation                         Scale) with scores of: Right Colon = 3, Transverse                         Colon = 3 and Left  Colon = 3 (entire mucosa seen well                         with no residual staining, small fragments of stool or                         opaque liquid). The total BBPS score equals 9. The                         terminal ileum, ileocecal valve, appendiceal orifice,                         and rectum were photographed. Findings:      The perianal and digital rectal examinations were normal. Pertinent       negatives include normal sphincter tone and no palpable rectal lesions.      The terminal ileum appeared normal.      The entire examined colon appeared normal.      The retroflexed view of the distal rectum and anal verge was normal and       showed no anal or rectal abnormalities. Impression:            - The examined portion of the ileum was normal.                        - The entire examined colon is normal.                        - The distal rectum and anal verge are normal on                         retroflexion view.                        -  No specimens collected. Recommendation:        - Discharge patient to home (with escort).                        - Resume previous diet today.                        - Continue present medications.                        - Repeat colonoscopy in 10 years for screening                         purposes. Procedure Code(s):     --- Professional ---                        H9894, Colorectal cancer screening; colonoscopy on                         individual at high risk Diagnosis Code(s):     --- Professional ---                        Z86.010, Personal history of colonic polyps CPT copyright 2022 American Medical Association. All rights reserved. The codes documented in this report are preliminary and upon coder review may  be revised to meet current compliance requirements. Dr. Corinn Brooklyn Corinn Jess Brooklyn MD, MD 03/20/2024 11:21:15 AM This report has been signed electronically. Number of Addenda: 0 Note Initiated On: 03/20/2024 10:57  AM Scope Withdrawal Time: 0 hours 6 minutes 29 seconds  Total Procedure Duration: 0 hours 10 minutes 14 seconds  Estimated Blood Loss:  Estimated blood loss: none.      [facility]

## 2024-03-20 NOTE — H&P (Signed)
 "   Cheryl JONELLE Brooklyn, MD [facility] Gastroenter[facility], DHIP [address]  [place], [place] [address]  Main: [phone] Fax:  [phone] Pager: [phone]   Primary Care Physician:  Gretta Comer POUR, NP Primary Gastroenterologist:  Dr. Corinn JONELLE Schmidt  Pre-Procedure History & Physical: HPI:  Cheryl Schmidt is a 54 y.o. female is here for an colonoscopy.   Past Medical History:  Diagnosis Date   Abnormal uterine bleeding due to leiomyoma of uterus    Family history of adverse reaction to anesthesia    mother has N/V   Fibroids    Headache    Intramural leiomyoma of uterus 06/10/2016   Localized swelling of right lower extremity 05/20/2020   Melanoma (HCC) 2001   upper abdomen Dr Hester removed   Menorrhagia with regular cycle    Occult blood in stools    Ovarian cyst    S/P hysterectomy 07/15/2019   Uterine bleeding    Uterine leiomyoma     Past Surgical History:  Procedure Laterality Date   COLONOSCOPY WITH PROPOFOL  N/A 10/10/2018   Procedure: COLONOSCOPY WITH PROPOFOL ;  Surgeon: Schmidt Cheryl Skiff, MD;  Location: [facility] END[facility];  Service: Gastroenterology;  Laterality: N/A;   CYSTOSCOPY Bilateral 07/15/2019   Procedure: CYSTOSCOPY Ureteral injection of ICG for the purpose of ureteral identification ;  Surgeon: Penne Knee, MD;  Location: [facility] ORS;  Service: Urology;  Laterality: Bilateral;   CYSTOSCOPY W/ URETERAL STENT PLACEMENT Left 07/15/2019   Procedure: CYSTOSCOPY WITH RETROGRADE PYELOGRAM/URETERAL STENT PLACEMENT;  Surgeon: Penne Knee, MD;  Location: [facility] ORS;  Service: Urology;  Laterality: Left;   Excision of melanoma  2001   INTRAUTERINE DEVICE (IUD) INSERTION  2009   IUD REMOVAL  2014   ROBOTIC ASSISTED TOTAL HYSTERECTOMY WITH BILATERAL SALPINGO OOPHERECTOMY Bilateral 07/15/2019   Procedure: XI ROBOTIC ASSISTED TOTAL HYSTERECTOMY WITH BILATERAL SALPINGECTOMY;  Surgeon: Victor Claudell JONELLE, MD;  Location: [facility] ORS;  Service: Gynecology;   Laterality: Bilateral;   WISDOM TOOTH EXTRACTION      Prior to Admission medications  Medication Sig Start Date End Date Taking? Authorizing Provider  acetaminophen  (TYLENOL ) 500 MG tablet Take 2 tablets (1,000 mg total) by mouth every 6 (six) hours. 07/17/19   Schuman, Christanna R, MD  cholecalciferol (VITAMIN D3) 25 MCG (1000 UNIT) tablet Take 1,000 Units by mouth daily.    [provider]  ibuprofen  (ADVIL ) 600 MG tablet Take 1 tablet (600 mg total) by mouth every 6 (six) hours as needed for fever or headache. 07/17/19   Schuman, Christanna R, MD  Multiple Vitamin (MULTIVITAMIN WITH MINERALS) TABS tablet Take 1 tablet by mouth daily.    [provider]  vitamin B-12 (CYANOCOBALAMIN ) 500 MCG tablet Take 500 mcg by mouth daily.    [provider]    Allergies as of 02/24/2024 - Review Complete 11/05/2023  Allergen Reaction Noted   Promethazine Other (See Comments) 06/04/2014    Family History  Problem Relation Age of Onset   Breast cancer Mother 75   Hypertension Father    Diabetes Father    Hyperlipidemia Brother    Hypertension Brother    Colon cancer Paternal Uncle 22   Colon cancer Paternal Grandfather 55    Social History   Socioeconomic History   Marital status: Married    Spouse name: Dorise   Number of children: 2   Years of education: Not on file   Highest education level: Not on file  Occupational History   Occupation: Advertising account planner  Tobacco Use   Smoking status: Never   Smokeless tobacco: Never  Vaping Use   Vaping status: Never Used  Substance and Sexual Activity   Alcohol use: Yes    Comment: 1 glass of wine/month   Drug use: Never   Sexual activity: Yes    Partners: Male    Birth control/protection: Surgical    Comment: vasectomy  Other Topics Concern   Not on file  Social History Narrative   Married.   2 children.   Works in Community Education Officer.   Enjoys spending time with family, walking, exercising.   Social Drivers of  Health   Tobacco Use: Low Risk  (02/24/2024)   Received from [facility]   Patient History    Smoking Tobacco Use: Never    Smokeless Tobacco Use: Never    Passive Exposure: Not on file  Financial Resource Strain: Not on file  Food Insecurity: Not on file  Transportation Needs: Not on file  Physical Activity: Not on file  Stress: Not on file  Social Connections: Not on file  Intimate Partner Violence: Not on file  Depression (PHQ2-9): Low Risk (11/05/2023)   Depression (PHQ2-9)    PHQ-2 Score: 2  Alcohol Screen: Not on file  Housing: Not on file  Utilities: Not on file  Health Literacy: Not on file    Review of Systems: See HPI, otherwise negative ROS  Physical Exam: BP (!) 136/123   Pulse (!) 56   Temp (!) 97.3 F (36.3 C) (Temporal)   Resp 20   Ht 5' 4 (1.626 m)   Wt 73.2 kg   LMP 06/06/2019   SpO2 100%   BMI 27.70 kg/m  General:   Alert,  pleasant and cooperative in NAD Head:  Normocephalic and atraumatic. Neck:  Supple; no masses or thyromegaly. Lungs:  Clear throughout to auscultation.    Heart:  Regular rate and rhythm. Abdomen:  Soft, nontender and nondistended. Normal bowel sounds, without guarding, and without rebound.   Neurologic:  Alert and  oriented x4;  grossly normal neurologically.  Impression/Plan: Cheryl Schmidt is here for an colonoscopy to be performed for Personal history of adenomatous and serrated colon polyps   Risks, benefits, limitations, and alternatives regarding  colonoscopy have been reviewed with the patient.  Questions have been answered.  All parties agreeable.   Cheryl Brooklyn, MD  03/20/2024, 11:01 AM "

## 2024-03-20 NOTE — Anesthesia Postprocedure Evaluation (Signed)
"   Anesthesia Post Note  Patient: Cheryl Schmidt  Procedure(s) Performed: COLONOSCOPY  Patient location during evaluation: PACU Anesthesia Type: General Level of consciousness: awake and alert Pain management: pain level controlled Vital Signs Assessment: post-procedure vital signs reviewed and stable Respiratory status: spontaneous breathing, nonlabored ventilation, respiratory function stable and patient connected to nasal cannula oxygen Cardiovascular status: blood pressure returned to baseline and stable Postop Assessment: no apparent nausea or vomiting Anesthetic complications: no   No notable events documented.   Last Vitals:  Vitals:   03/20/24 1134 03/20/24 1144  BP: 105/64 120/71  Pulse: 66 68  Resp: 12 16  Temp:    SpO2: 100% 99%    Last Pain:  Vitals:   03/20/24 1134  TempSrc:   PainSc: 0-No pain                 Shau-Shau Elianny Buxbaum      "

## 2024-03-20 NOTE — Transfer of Care (Signed)
 Immediate Anesthesia Transfer of Care Note  Patient: Cheryl Schmidt  Procedure(s) Performed: COLONOSCOPY  Patient Location: PACU  Anesthesia Type:General  Level of Consciousness: sedated and drowsy  Airway & Oxygen Therapy: Patient Spontanous Breathing  Post-op Assessment: Report given to RN and Post -op Vital signs reviewed and stable  Post vital signs: stable  Last Vitals:  Vitals Value Taken Time  BP 100/70 03/20/24 11:24  Temp    Pulse 70 03/20/24 11:24  Resp 15 03/20/24 11:24  SpO2 98 % 03/20/24 11:24  Vitals shown include unfiled device data.  Last Pain:  Vitals:   03/20/24 1043  TempSrc: Temporal         Complications: No notable events documented.
